# Patient Record
Sex: Male | Born: 1980 | Race: Black or African American | Hispanic: No | Marital: Single
Health system: Southern US, Community
[De-identification: ages and names within clinical notes are randomized; demographics above are authoritative.]

## PROBLEM LIST (undated history)

## (undated) DIAGNOSIS — I10 Essential (primary) hypertension: Secondary | ICD-10-CM

## (undated) DIAGNOSIS — E785 Hyperlipidemia, unspecified: Secondary | ICD-10-CM

---

## 1998-01-06 ENCOUNTER — Emergency Department (HOSPITAL_COMMUNITY): Admission: EM | Admit: 1998-01-06 | Discharge: 1998-01-06 | Payer: Self-pay | Admitting: Emergency Medicine

## 2000-10-05 ENCOUNTER — Emergency Department (HOSPITAL_COMMUNITY): Admission: EM | Admit: 2000-10-05 | Discharge: 2000-10-05 | Payer: Self-pay

## 2001-02-04 ENCOUNTER — Encounter: Payer: Self-pay | Admitting: Emergency Medicine

## 2001-02-04 ENCOUNTER — Emergency Department (HOSPITAL_COMMUNITY): Admission: EM | Admit: 2001-02-04 | Discharge: 2001-02-04 | Payer: Self-pay | Admitting: Emergency Medicine

## 2001-11-06 ENCOUNTER — Encounter: Payer: Self-pay | Admitting: Emergency Medicine

## 2001-11-06 ENCOUNTER — Emergency Department (HOSPITAL_COMMUNITY): Admission: EM | Admit: 2001-11-06 | Discharge: 2001-11-06 | Payer: Self-pay | Admitting: Emergency Medicine

## 2001-11-25 ENCOUNTER — Emergency Department (HOSPITAL_COMMUNITY): Admission: EM | Admit: 2001-11-25 | Discharge: 2001-11-25 | Payer: Self-pay | Admitting: Emergency Medicine

## 2001-12-09 ENCOUNTER — Emergency Department (HOSPITAL_COMMUNITY): Admission: EM | Admit: 2001-12-09 | Discharge: 2001-12-09 | Payer: Self-pay | Admitting: Emergency Medicine

## 2003-11-15 ENCOUNTER — Emergency Department (HOSPITAL_COMMUNITY): Admission: EM | Admit: 2003-11-15 | Discharge: 2003-11-16 | Payer: Self-pay | Admitting: Emergency Medicine

## 2004-05-13 ENCOUNTER — Emergency Department (HOSPITAL_COMMUNITY): Admission: EM | Admit: 2004-05-13 | Discharge: 2004-05-13 | Payer: Self-pay | Admitting: Emergency Medicine

## 2004-09-19 ENCOUNTER — Emergency Department (HOSPITAL_COMMUNITY): Admission: EM | Admit: 2004-09-19 | Discharge: 2004-09-19 | Payer: Self-pay | Admitting: Emergency Medicine

## 2004-11-20 ENCOUNTER — Emergency Department (HOSPITAL_COMMUNITY): Admission: EM | Admit: 2004-11-20 | Discharge: 2004-11-20 | Payer: Self-pay | Admitting: Emergency Medicine

## 2005-03-14 ENCOUNTER — Emergency Department (HOSPITAL_COMMUNITY): Admission: EM | Admit: 2005-03-14 | Discharge: 2005-03-14 | Payer: Self-pay | Admitting: Emergency Medicine

## 2005-05-07 ENCOUNTER — Emergency Department (HOSPITAL_COMMUNITY): Admission: EM | Admit: 2005-05-07 | Discharge: 2005-05-08 | Payer: Self-pay | Admitting: *Deleted

## 2005-05-28 ENCOUNTER — Emergency Department (HOSPITAL_COMMUNITY): Admission: EM | Admit: 2005-05-28 | Discharge: 2005-05-28 | Payer: Self-pay | Admitting: Emergency Medicine

## 2005-12-19 ENCOUNTER — Emergency Department (HOSPITAL_COMMUNITY): Admission: EM | Admit: 2005-12-19 | Discharge: 2005-12-19 | Payer: Self-pay | Admitting: Emergency Medicine

## 2006-01-28 ENCOUNTER — Emergency Department (HOSPITAL_COMMUNITY): Admission: EM | Admit: 2006-01-28 | Discharge: 2006-01-29 | Payer: Self-pay | Admitting: Emergency Medicine

## 2006-04-03 ENCOUNTER — Emergency Department (HOSPITAL_COMMUNITY): Admission: EM | Admit: 2006-04-03 | Discharge: 2006-04-03 | Payer: Self-pay | Admitting: Emergency Medicine

## 2006-04-20 ENCOUNTER — Emergency Department (HOSPITAL_COMMUNITY): Admission: EM | Admit: 2006-04-20 | Discharge: 2006-04-20 | Payer: Self-pay | Admitting: Emergency Medicine

## 2006-07-16 ENCOUNTER — Emergency Department (HOSPITAL_COMMUNITY): Admission: EM | Admit: 2006-07-16 | Discharge: 2006-07-17 | Payer: Self-pay | Admitting: Emergency Medicine

## 2006-09-23 ENCOUNTER — Emergency Department (HOSPITAL_COMMUNITY): Admission: EM | Admit: 2006-09-23 | Discharge: 2006-09-23 | Payer: Self-pay | Admitting: Emergency Medicine

## 2007-05-14 ENCOUNTER — Emergency Department (HOSPITAL_COMMUNITY): Admission: EM | Admit: 2007-05-14 | Discharge: 2007-05-15 | Payer: Self-pay | Admitting: Emergency Medicine

## 2007-06-14 ENCOUNTER — Emergency Department (HOSPITAL_COMMUNITY): Admission: EM | Admit: 2007-06-14 | Discharge: 2007-06-14 | Payer: Self-pay | Admitting: Emergency Medicine

## 2007-08-10 ENCOUNTER — Emergency Department (HOSPITAL_COMMUNITY): Admission: EM | Admit: 2007-08-10 | Discharge: 2007-08-10 | Payer: Self-pay | Admitting: Emergency Medicine

## 2007-12-07 ENCOUNTER — Emergency Department (HOSPITAL_COMMUNITY): Admission: EM | Admit: 2007-12-07 | Discharge: 2007-12-08 | Payer: Self-pay | Admitting: Emergency Medicine

## 2008-06-02 ENCOUNTER — Emergency Department (HOSPITAL_COMMUNITY): Admission: EM | Admit: 2008-06-02 | Discharge: 2008-06-02 | Payer: Self-pay | Admitting: Emergency Medicine

## 2008-10-14 ENCOUNTER — Emergency Department (HOSPITAL_COMMUNITY): Admission: EM | Admit: 2008-10-14 | Discharge: 2008-10-14 | Payer: Self-pay | Admitting: *Deleted

## 2010-04-12 ENCOUNTER — Emergency Department (HOSPITAL_COMMUNITY): Admission: EM | Admit: 2010-04-12 | Discharge: 2010-04-12 | Payer: Self-pay | Admitting: Emergency Medicine

## 2010-06-21 ENCOUNTER — Emergency Department (HOSPITAL_COMMUNITY): Admission: EM | Admit: 2010-06-21 | Discharge: 2009-09-27 | Payer: Self-pay | Admitting: Emergency Medicine

## 2010-06-25 ENCOUNTER — Emergency Department (HOSPITAL_COMMUNITY)
Admission: EM | Admit: 2010-06-25 | Discharge: 2010-06-25 | Payer: Self-pay | Source: Home / Self Care | Admitting: Emergency Medicine

## 2010-09-09 ENCOUNTER — Emergency Department (HOSPITAL_COMMUNITY)
Admission: EM | Admit: 2010-09-09 | Discharge: 2010-09-09 | Disposition: A | Discharge status: Home / Self Care | Payer: Self-pay | Attending: Emergency Medicine | Admitting: Emergency Medicine

## 2010-09-09 DIAGNOSIS — R059 Cough, unspecified: Secondary | ICD-10-CM | POA: Insufficient documentation

## 2010-09-09 DIAGNOSIS — J069 Acute upper respiratory infection, unspecified: Secondary | ICD-10-CM | POA: Insufficient documentation

## 2010-09-09 DIAGNOSIS — I10 Essential (primary) hypertension: Secondary | ICD-10-CM | POA: Insufficient documentation

## 2010-09-09 DIAGNOSIS — R05 Cough: Secondary | ICD-10-CM | POA: Insufficient documentation

## 2010-09-27 LAB — SYNOVIAL CELL COUNT + DIFF, W/ CRYSTALS
Crystals, Fluid: NONE SEEN
Eosinophils-Synovial: 0 % (ref 0–1)
Lymphocytes-Synovial Fld: 72 % — ABNORMAL HIGH (ref 0–20)
Monocyte-Macrophage-Synovial Fluid: 18 % — ABNORMAL LOW (ref 50–90)
Neutrophil, Synovial: 10 % (ref 0–25)
WBC, Synovial: 385 /mm3 — ABNORMAL HIGH (ref 0–200)

## 2010-09-27 LAB — BODY FLUID CULTURE: Culture: NO GROWTH

## 2010-09-27 LAB — ANAEROBIC CULTURE

## 2010-10-05 LAB — RAPID STREP SCREEN (MED CTR MEBANE ONLY): Streptococcus, Group A Screen (Direct): NEGATIVE

## 2010-10-05 LAB — POCT I-STAT, CHEM 8
Calcium, Ion: 1.21 mmol/L (ref 1.12–1.32)
Chloride: 104 mEq/L (ref 96–112)
Glucose, Bld: 100 mg/dL — ABNORMAL HIGH (ref 70–99)
HCT: 51 % (ref 39.0–52.0)
Potassium: 4.2 mEq/L (ref 3.5–5.1)
TCO2: 29 mmol/L (ref 0–100)

## 2010-10-10 ENCOUNTER — Emergency Department (HOSPITAL_COMMUNITY)
Admission: EM | Admit: 2010-10-10 | Discharge: 2010-10-10 | Disposition: A | Discharge status: Home / Self Care | Payer: Self-pay | Attending: Emergency Medicine | Admitting: Emergency Medicine

## 2010-10-10 DIAGNOSIS — R599 Enlarged lymph nodes, unspecified: Secondary | ICD-10-CM | POA: Insufficient documentation

## 2010-10-10 DIAGNOSIS — J020 Streptococcal pharyngitis: Secondary | ICD-10-CM | POA: Insufficient documentation

## 2010-10-24 LAB — URINALYSIS, ROUTINE W REFLEX MICROSCOPIC
Glucose, UA: NEGATIVE mg/dL
Leukocytes, UA: NEGATIVE
Nitrite: NEGATIVE
Protein, ur: 30 mg/dL — AB
Urobilinogen, UA: 0.2 mg/dL (ref 0.0–1.0)
pH: 5.5 (ref 5.0–8.0)

## 2010-10-24 LAB — URINE MICROSCOPIC-ADD ON

## 2011-04-04 LAB — URINALYSIS, ROUTINE W REFLEX MICROSCOPIC
Glucose, UA: NEGATIVE
Hgb urine dipstick: NEGATIVE
Ketones, ur: NEGATIVE
Protein, ur: NEGATIVE
Urobilinogen, UA: 0.2

## 2011-04-04 LAB — COMPREHENSIVE METABOLIC PANEL
AST: 27
Alkaline Phosphatase: 69
CO2: 24
GFR calc Af Amer: >60
Glucose, Bld: 101 — ABNORMAL HIGH
Sodium: 137

## 2011-04-04 LAB — DIFFERENTIAL
Basophils Relative: 0
Eosinophils Absolute: 0.1
Monocytes Absolute: 0.3
Monocytes Relative: 5

## 2011-04-04 LAB — CBC: WBC: 7

## 2011-05-07 ENCOUNTER — Emergency Department (HOSPITAL_COMMUNITY)
Admission: EM | Admit: 2011-05-07 | Discharge: 2011-05-07 | Disposition: A | Discharge status: Home / Self Care | Payer: Self-pay | Attending: Emergency Medicine | Admitting: Emergency Medicine

## 2011-05-07 DIAGNOSIS — L0100 Impetigo, unspecified: Secondary | ICD-10-CM | POA: Insufficient documentation

## 2012-03-06 ENCOUNTER — Emergency Department (HOSPITAL_COMMUNITY)
Admission: EM | Admit: 2012-03-06 | Discharge: 2012-03-06 | Disposition: A | Discharge status: Home / Self Care | Payer: Self-pay | Attending: Emergency Medicine | Admitting: Emergency Medicine

## 2012-03-06 ENCOUNTER — Encounter (HOSPITAL_COMMUNITY): Payer: Self-pay | Admitting: Emergency Medicine

## 2012-03-06 DIAGNOSIS — F439 Reaction to severe stress, unspecified: Secondary | ICD-10-CM

## 2012-03-06 DIAGNOSIS — F430 Acute stress reaction: Secondary | ICD-10-CM | POA: Insufficient documentation

## 2012-03-06 DIAGNOSIS — E860 Dehydration: Secondary | ICD-10-CM | POA: Insufficient documentation

## 2012-03-06 LAB — POCT I-STAT, CHEM 8
Calcium, Ion: 1.23 mmol/L (ref 1.12–1.23)
Glucose, Bld: 98 mg/dL (ref 70–99)
HCT: 52 % (ref 39.0–52.0)
Hemoglobin: 17.7 g/dL — ABNORMAL HIGH (ref 13.0–17.0)

## 2012-03-06 MED ORDER — TRAMADOL HCL 50 MG PO TABS
50.0000 mg | ORAL_TABLET | Freq: Once | ORAL | Status: AC
  Administered 2012-03-06: 50 mg via ORAL
  Filled 2012-03-06: qty 1

## 2012-03-06 NOTE — ED Notes (Signed)
Pt states he started feeling bad toward the end of last week  Pt states he has had the N/V/D since Sunday but has been worse since Tuesday  Pt states he has been having body aches and chills as well   Pt states he has been using OTC meds without relief

## 2012-03-06 NOTE — ED Provider Notes (Signed)
History     CSN: [id number]  Arrival date & time 03/06/12  0247   First MD Initiated Contact with Patient 03/06/12 0403      Chief Complaint  Patient presents with  . Nausea  . Emesis  . Diarrhea    (Consider location/radiation/quality/duration/timing/severity/associated sxs/prior treatment) HPI  Pt to the ER requesting note for work to be able to sleep this weekend. 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 He says that he is not getting any sleep and they will not let him have his time off. He says that the anxiety is causing him to have nausea, intermittent episodes of vomiting and some "nervous diarrhea". He denies having any abdominal pain. He admits throughout his life whenever he is stressed he has theses symptoms. He denies having fevers, chills, weakness. VSS/NAD.  History reviewed. No pertinent past medical history.  History reviewed. No pertinent past surgical history.  Family History  Problem Relation Age of Onset  . Hypertension Other   . Diabetes Other   . Coronary artery disease Other     History  Substance Use Topics  . Smoking status: Never Smoker   . Smokeless tobacco: Not on file  . Alcohol Use: No      Review of Systems  All other systems reviewed and are negative.    Allergies  Review of patient's allergies indicates no known allergies.  Home Medications   Current Outpatient Rx  Name Route Sig Dispense Refill  . IBUPROFEN 200 MG PO TABS Oral Take 200 mg by mouth every 6 (six) hours as needed.      BP 149/99  Pulse 95  Temp 98.3 F (36.8 C) (Oral)  Resp 16  Ht 6' (1.829 m)  Wt 255 lb (115.667 kg)  BMI 34.58 kg/m2  SpO2 100%  Physical Exam  Nursing note and vitals reviewed. Constitutional: He appears well-developed and well-nourished. No distress.  HENT:  Head: Normocephalic and atraumatic.  Eyes: Pupils are equal, round, and reactive to  light.  Neck: Normal range of motion. Neck supple.  Cardiovascular: Normal rate and regular rhythm.   Pulmonary/Chest: Effort normal.  Abdominal: Soft. He exhibits no distension. There is no tenderness. There is no rebound and no guarding.  Neurological: He is alert.  Skin: Skin is warm and dry.    ED Course  Procedures (including critical care time)  Labs Reviewed  POCT I-STAT, CHEM 8 - Abnormal; Notable for the following:    Hemoglobin 17.7 (*)     All other components within normal limits   No results found.   1. Dehydration   2. Stress       MDM  Pt tired from work, I-stat normal. Will give weekend off so he can sleep. He said the changes at [facility] will only last two more weeks. No concerning symptoms that warrant further work-up. Pt declines ACT evaluation.   Pt has been advised of the symptoms that warrant their return to the ED. Patient has voiced understanding and has agreed to follow-up with the PCP or specialist.       Dorthula Matas, PA 03/06/12 [phone]

## 2012-03-07 NOTE — ED Provider Notes (Signed)
Medical screening examination/treatment/procedure(s) were performed by non-physician practitioner and as supervising physician I was immediately available for consultation/collaboration.   Vandy Tsuchiya, MD 03/07/12 2302 

## 2012-07-16 ENCOUNTER — Emergency Department (HOSPITAL_COMMUNITY)
Admission: EM | Admit: 2012-07-16 | Discharge: 2012-07-16 | Disposition: A | Discharge status: Home / Self Care | Payer: Self-pay | Attending: Emergency Medicine | Admitting: Emergency Medicine

## 2012-07-16 ENCOUNTER — Encounter (HOSPITAL_COMMUNITY): Payer: Self-pay | Admitting: Emergency Medicine

## 2012-07-16 DIAGNOSIS — R6889 Other general symptoms and signs: Secondary | ICD-10-CM | POA: Insufficient documentation

## 2012-07-16 DIAGNOSIS — R05 Cough: Secondary | ICD-10-CM | POA: Insufficient documentation

## 2012-07-16 DIAGNOSIS — J329 Chronic sinusitis, unspecified: Secondary | ICD-10-CM | POA: Insufficient documentation

## 2012-07-16 DIAGNOSIS — R059 Cough, unspecified: Secondary | ICD-10-CM | POA: Insufficient documentation

## 2012-07-16 DIAGNOSIS — R197 Diarrhea, unspecified: Secondary | ICD-10-CM | POA: Insufficient documentation

## 2012-07-16 DIAGNOSIS — J3489 Other specified disorders of nose and nasal sinuses: Secondary | ICD-10-CM | POA: Insufficient documentation

## 2012-07-16 MED ORDER — AMOXICILLIN 500 MG PO CAPS: 500.0000 mg | ORAL_CAPSULE | Freq: Three times a day (TID) | ORAL | Status: DC

## 2012-07-16 MED ORDER — ALBUTEROL SULFATE HFA 108 (90 BASE) MCG/ACT IN AERS
2.0000 | INHALATION_SPRAY | RESPIRATORY_TRACT | Status: DC | PRN
  Administered 2012-07-16: 2 via RESPIRATORY_TRACT
  Filled 2012-07-16: qty 6.7

## 2012-07-16 NOTE — ED Provider Notes (Signed)
Medical screening examination/treatment/procedure(s) were performed by non-physician practitioner and as supervising physician I was immediately available for consultation/collaboration.  Ethelda Chick, MD 07/16/12 [phone]

## 2012-07-16 NOTE — ED Provider Notes (Signed)
History     CSN: [id number]  Arrival date & time 07/16/12  0865   First MD Initiated Contact with Patient 07/16/12 [phone]      Chief Complaint  Patient presents with  . URI    (Consider location/radiation/quality/duration/timing/severity/associated sxs/prior treatment) HPI Comments: This is a 32 year old male, who presents emergency department with chief complaint of cough and cold symptoms x3 weeks. Patient also endorses sinus congestion, runny nose, and cough. States that yesterday he began having some diarrhea as well. He has taken over-the-counter cough and cold medicines with little relief. His symptoms have been persistent, without improvement. He denies fever, chest pain, shortness of breath, nausea, and vomiting.  The history is provided by the patient. No language interpreter was used.    History reviewed. No pertinent past medical history.  History reviewed. No pertinent past surgical history.  Family History  Problem Relation Age of Onset  . Hypertension Other   . Diabetes Other   . Coronary artery disease Other     History  Substance Use Topics  . Smoking status: Never Smoker   . Smokeless tobacco: Not on file  . Alcohol Use: No      Review of Systems  All other systems reviewed and are negative.    Allergies  Review of patient's allergies indicates no known allergies.  Home Medications   Current Outpatient Rx  Name  Route  Sig  Dispense  Refill  . IBUPROFEN 200 MG PO TABS   Oral   Take 200 mg by mouth every 6 (six) hours as needed.           BP 155/79  Pulse 107  Temp 98.3 F (36.8 C) (Oral)  Resp 16  SpO2 99%  Physical Exam  Nursing note and vitals reviewed. Constitutional: He is oriented to person, place, and time. He appears well-developed and well-nourished.  HENT:  Head: Normocephalic and atraumatic.  Right Ear: External ear normal.  Left Ear: External ear normal.  Nose: Nose normal.  Mouth/Throat: Oropharynx is clear and  moist. No oropharyngeal exudate.       Swollen, erythematous turbinates, maxillary sinuses tender to palpation  Eyes: Conjunctivae normal and EOM are normal. Pupils are equal, round, and reactive to light. Right eye exhibits no discharge. Left eye exhibits no discharge. No scleral icterus.  Neck: Normal range of motion. Neck supple. No JVD present.  Cardiovascular: Normal rate, regular rhythm, normal heart sounds and intact distal pulses.  Exam reveals no gallop and no friction rub.   No murmur heard. Pulmonary/Chest: Effort normal and breath sounds normal. No respiratory distress. He has no wheezes. He has no rales. He exhibits no tenderness.  Abdominal: Soft. Bowel sounds are normal. He exhibits no distension and no mass. There is no tenderness. There is no rebound and no guarding.  Musculoskeletal: Normal range of motion. He exhibits no edema and no tenderness.  Neurological: He is alert and oriented to person, place, and time. He has normal reflexes.       CN 3-12 intact  Skin: Skin is warm and dry.  Psychiatric: He has a normal mood and affect. His behavior is normal. Judgment and thought content normal.    ED Course  Procedures (including critical care time)  Labs Reviewed - No data to display No results found.   1. Sinusitis       MDM  32 year old male with sinusitis.  Will treat the patient with Amoxicillin as he has been having symptoms for almost  3 weeks.  Will also give the patient and inhaler, and recommended Sudafed and Delysm.  The patient understands and agrees with the plan.  He is stable and ready for discharge.        Roxy Horseman, PA-C 07/16/12 [phone]

## 2012-07-16 NOTE — ED Notes (Signed)
Pt c/o cold s/s 3 weeks, now having diarrhea, increasing weakness.

## 2014-11-29 ENCOUNTER — Ambulatory Visit (INDEPENDENT_AMBULATORY_CARE_PROVIDER_SITE_OTHER): Payer: No Typology Code available for payment source | Admitting: Emergency Medicine

## 2014-11-29 VITALS — BP 126/70 | HR 100 | Temp 98.2°F | Resp 18 | Wt 251.0 lb

## 2014-11-29 DIAGNOSIS — S335XXA Sprain of ligaments of lumbar spine, initial encounter: Secondary | ICD-10-CM | POA: Diagnosis not present

## 2014-11-29 MED ORDER — HYDROCODONE-ACETAMINOPHEN 5-325 MG PO TABS: 1.0000 | ORAL_TABLET | ORAL | Status: DC | PRN

## 2014-11-29 MED ORDER — NAPROXEN SODIUM 550 MG PO TABS: 550.0000 mg | ORAL_TABLET | Freq: Two times a day (BID) | ORAL | Status: AC

## 2014-11-29 MED ORDER — CYCLOBENZAPRINE HCL 10 MG PO TABS: 10.0000 mg | ORAL_TABLET | Freq: Three times a day (TID) | ORAL | Status: DC | PRN

## 2014-11-29 NOTE — Patient Instructions (Signed)

## 2014-11-29 NOTE — Progress Notes (Signed)
Subjective:  Patient ID: Bobby Jones, male    DOB: 03/11/1981  Age: 34 y.o. MRN: [id number]  CC: Back Pain   HPI Bobby RidgeCarlos Sarris presents for back pain that began 2:00 yesterday afternoon. He offers no history of injury or overuse. Said that he went to work last night was unable to perform his duties slept all day and then when he attempted  to go to work today he was unable to do so despite taking medicine provided by his mother  He says that he has sharp lancinating pain in his back that radiates down both legs to feet intermittently. Worse when he bends twists or stands up. He has no history of prior back injury. He has no numbness tingling or weakness in his legs.  He has no urinary or stool incontinence. Has no improvement with home medications.  He denies any other current complaint.  History Mikle BosworthCarlos has no past medical history on file.   He has no past surgical history on file.   His family history includes Coronary artery disease in his other; Diabetes in his father and other; Hypertension in his other.He reports that he has never smoked. He does not have any smokeless tobacco history on file. He reports that he does not drink alcohol or use illicit drugs.  Outpatient Prescriptions Prior to Visit  Medication Sig Dispense Refill  . amoxicillin (AMOXIL) 500 MG capsule Take 1 capsule (500 mg total) by mouth 3 (three) times daily. (Patient not taking: Reported on 11/29/2014) 21 capsule 0  . ibuprofen (ADVIL,MOTRIN) 200 MG tablet Take 200 mg by mouth every 6 (six) hours as needed.     No facility-administered medications prior to visit.    ROS Review of Systems  Constitutional: Negative for fever, chills, diaphoresis, activity change, appetite change, fatigue HENT: Negative for , congestion, sore throat, rhinorrhea, sneezing, trouble swallowing, neck pain,  postnasal drip and tinnitus.  Eyes: Negative for photophobia, pain, discharge, redness and visual disturbance.  Respiratory:  Negative for cough, chest tightness, shortness of breath and wheezing.  Cardiovascular: Negative for chest pain, palpitations   Gastrointestinal: Negative for nausea, abdominal pain, diarrhea, constipation and blood in stool.  Endocrine: Negative for  polyuria.  Genitourinary: Negative for dysuria, urgency, frequency, hematuria,  Musculoskeletal: Negative for back pain and gait problem.  Neurological: Negative for dizziness, weakness and headaches.  Psychiatric/Behavioral: Negative for  dysphoric mood. The patient is not nervous/anxious.      Objective:  BP 126/70 mmHg  Pulse 100  Temp(Src) 98.2 F (36.8 C) (Oral)  Resp 18  Wt 251 lb (113.853 kg)  SpO2 99%  Physical Exam  GEN: WDWN, moderate distress, Non-toxic, A & O x 3 HEENT: Atraumatic, Normocephalic. Neck supple. No masses, No LAD. Ears and Nose: No external deformity. CV: RRR, No M/G/R. No JVD. No thrill. No extra heart sounds. PULM: CTA B, no wheezes, crackles, rhonchi. No retractions. No resp. distress. No accessory muscle use. ABD: S, NT, ND, +BS. No rebound. No HSM. EXTR: No c/c/e NEURO Normal gait.  PSYCH: Normally interactive. Conversant. Not depressed or anxious appearing.  Calm demeanor.  BACK  Tender lumbar paraspinous muscles.  No weakness  Assessment & Plan:   Mikle BosworthCarlos was seen today for back pain.  Diagnoses and all orders for this visit:  Sprain of lumbar region, initial encounter  Other orders -     naproxen sodium (ANAPROX DS) 550 MG tablet; Take 1 tablet (550 mg total) by mouth 2 (two) times daily with a meal. -  cyclobenzaprine (FLEXERIL) 10 MG tablet; Take 1 tablet (10 mg total) by mouth 3 (three) times daily as needed for muscle spasms. -     HYDROcodone-acetaminophen (NORCO) 5-325 MG per tablet; Take 1-2 tablets by mouth every 4 (four) hours as needed.   I am having Mr. Detamore start on naproxen sodium, cyclobenzaprine, and HYDROcodone-acetaminophen. I am also having him maintain his ibuprofen  and amoxicillin.  Meds ordered this encounter  Medications  . naproxen sodium (ANAPROX DS) 550 MG tablet    Sig: Take 1 tablet (550 mg total) by mouth 2 (two) times daily with a meal.    Dispense:  40 tablet    Refill:  0  . cyclobenzaprine (FLEXERIL) 10 MG tablet    Sig: Take 1 tablet (10 mg total) by mouth 3 (three) times daily as needed for muscle spasms.    Dispense:  30 tablet    Refill:  0  . HYDROcodone-acetaminophen (NORCO) 5-325 MG per tablet    Sig: Take 1-2 tablets by mouth every 4 (four) hours as needed.    Dispense:  30 tablet    Refill:  0     Follow-up: No Follow-up on file.  Carmelina DaneAnderson, Gerline Ratto S, MD

## 2014-11-30 ENCOUNTER — Encounter (HOSPITAL_COMMUNITY): Payer: Self-pay | Admitting: Emergency Medicine

## 2014-11-30 ENCOUNTER — Emergency Department (HOSPITAL_COMMUNITY)
Admission: EM | Admit: 2014-11-30 | Discharge: 2014-11-30 | Disposition: A | Discharge status: Home / Self Care | Payer: No Typology Code available for payment source | Attending: Emergency Medicine | Admitting: Emergency Medicine

## 2014-11-30 DIAGNOSIS — I2510 Atherosclerotic heart disease of native coronary artery without angina pectoris: Secondary | ICD-10-CM | POA: Insufficient documentation

## 2014-11-30 DIAGNOSIS — M5441 Lumbago with sciatica, right side: Secondary | ICD-10-CM | POA: Insufficient documentation

## 2014-11-30 DIAGNOSIS — M5442 Lumbago with sciatica, left side: Secondary | ICD-10-CM | POA: Diagnosis not present

## 2014-11-30 DIAGNOSIS — M549 Dorsalgia, unspecified: Secondary | ICD-10-CM | POA: Diagnosis present

## 2014-11-30 DIAGNOSIS — I10 Essential (primary) hypertension: Secondary | ICD-10-CM | POA: Diagnosis not present

## 2014-11-30 DIAGNOSIS — Z792 Long term (current) use of antibiotics: Secondary | ICD-10-CM | POA: Insufficient documentation

## 2014-11-30 DIAGNOSIS — E119 Type 2 diabetes mellitus without complications: Secondary | ICD-10-CM | POA: Insufficient documentation

## 2014-11-30 DIAGNOSIS — Z7952 Long term (current) use of systemic steroids: Secondary | ICD-10-CM | POA: Diagnosis not present

## 2014-11-30 DIAGNOSIS — Z791 Long term (current) use of non-steroidal anti-inflammatories (NSAID): Secondary | ICD-10-CM | POA: Diagnosis not present

## 2014-11-30 MED ORDER — TRAMADOL HCL 50 MG PO TABS: 50.0000 mg | ORAL_TABLET | Freq: Four times a day (QID) | ORAL | Status: DC | PRN

## 2014-11-30 MED ORDER — PREDNISONE 20 MG PO TABS: ORAL_TABLET | ORAL | Status: DC

## 2014-11-30 MED ORDER — METHOCARBAMOL 500 MG PO TABS: 500.0000 mg | ORAL_TABLET | Freq: Two times a day (BID) | ORAL | Status: DC

## 2014-11-30 NOTE — ED Provider Notes (Signed)
CSN: [id number]     Arrival date & time 11/30/14  1457 History  This chart was scribed for non-physician practitioner Vangie BickerErin O' San MorelleMalley, PA-C, working with Purvis SheffieldForrest Harrison, MD, by Andrew Auaven Small, ED Scribe. This patient was seen in room WTR5/WTR5 and the patient's care was started at 3:51 PM.   Chief Complaint  Patient presents with  . Back Pain   The history is provided by the patient. No language interpreter was used.   Bobby RidgeCarlos Perkin is a 34 y.o. male with hx of HTN, DM and CAD who presents to the Emergency Department complaining of back pain. 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 He denies heavy lifting at work but does bend over and twisting often. [facility] and was prescribed flexeril, norco and naproxen and was given 2 days off from work. He present today with worsening back pain that radiates down bilateral legs. Pt reports back injury several years ago but denies back surgeries. Pt denies being seen by ortho. He denies nausea, emesis and diarrhea.  Denies fever or chills. No loss of bowel or bladder. Denies hx of IVDU or cancer.   History reviewed. No pertinent past medical history. History reviewed. No pertinent past surgical history. Family History  Problem Relation Age of Onset  . Hypertension Other   . Diabetes Other   . Coronary artery disease Other   . Diabetes Father    History  Substance Use Topics  . Smoking status: Never Smoker   . Smokeless tobacco: Not on file  . Alcohol Use: No    Review of Systems  Gastrointestinal: Negative for nausea, vomiting and diarrhea.  Musculoskeletal: Positive for myalgias and back pain.   Allergies  Review of patient's allergies indicates no known allergies.  Home Medications   Prior to Admission medications   Medication Sig Start Date End Date Taking? Authorizing  Provider  amoxicillin (AMOXIL) 500 MG capsule Take 1 capsule (500 mg total) by mouth 3 (three) times daily. Patient not taking: Reported on 11/29/2014 07/16/12   Roxy Horsemanobert Browning, PA-C  HYDROcodone-acetaminophen [facility](NORCO) 5-325 MG per tablet Take 1-2 tablets by mouth every 4 (four) hours as needed. 11/29/14   Carmelina DaneJeffery S Anderson, MD  ibuprofen (ADVIL,MOTRIN) 200 MG tablet Take 200 mg by mouth every 6 (six) hours as needed.    Historical Provider, MD  methocarbamol (ROBAXIN) 500 MG tablet Take 1 tablet (500 mg total) by mouth 2 (two) times daily. 11/30/14   Junius FinnerErin O'Malley, PA-C  naproxen sodium (ANAPROX DS) 550 MG tablet Take 1 tablet (550 mg total) by mouth 2 (two) times daily with a meal. 11/29/14 11/29/15  Carmelina DaneJeffery S Anderson, MD  predniSONE (DELTASONE) 20 MG tablet 3 tabs po day one, then 2 po daily x 4 days 11/30/14   Junius FinnerErin O'Malley, PA-C  traMADol (ULTRAM) 50 MG tablet Take 1 tablet (50 mg total) by mouth every 6 (six) hours as needed. 11/30/14   Junius FinnerErin O'Malley, PA-C   BP 148/90 mmHg  Pulse 102  Temp(Src) 98.3 F (36.8 C) (Oral)  Resp 20  SpO2 100% Physical Exam  Constitutional: He is oriented to person, place, and time. He appears well-developed and well-nourished. No distress.  HENT:  Head: Normocephalic and atraumatic.  Eyes: Conjunctivae and EOM are normal.  Neck: Neck supple.  Cardiovascular: Normal rate.   Pulmonary/Chest: Effort  normal.  Musculoskeletal: Normal range of motion. He exhibits tenderness.  FROM upper and lower extremities with 5/5 strength.  Tenderness to lower lumbar spine and bilateral lumbar muscles. No spinal step offs or crepitus.  Neurological: He is alert and oriented to person, place, and time.  Sensation in tact, symmetric bilaterally in upper and lower extremities.  Normal gait.  Skin: Skin is warm and dry. No erythema.  Low back: skin in tact, no ecchymosis or erythema. No rash.   Psychiatric: He has a normal mood and affect. His behavior is normal.  Nursing note  and vitals reviewed.   ED Course  Procedures (including critical care time) DIAGNOSTIC STUDIES: Oxygen Saturation is 100% on RA, normal by my interpretation.    COORDINATION OF CARE: 4:06 PM- Pt advised of plan for treatment and pt agrees.  Labs Review Labs Reviewed - No data to display  Imaging Review No results found.   EKG Interpretation None      MDM   Final diagnoses:  Bilateral low back pain with left-sided sciatica  Bilateral low back pain with right-sided sciatica    Pt is a 34yo male c/o continued lower back pain, no red flag symptoms. No hx of injury. No imaging indicated at this time. Will change pt's medication. Advised discontinuing flexeril, will prescribe robaxin, tramadol and prednisone. Home care instructions provided along with home stretches for sciatica as pain does occasional go down both legs. Encouraged to establish care with PCP, f/u in 1 week if not improving. Return precautions provided. Pt verbalized understanding and agreement with tx plan.   I personally performed the services described in this documentation, which was scribed in my presence. The recorded information has been reviewed and is accurate.    Junius Finnerrin O'Malley, PA-C 11/30/14 1642  Mirian MoMatthew Gentry, MD 12/02/14 [phone]

## 2014-11-30 NOTE — Discharge Instructions (Signed)
Back Pain, Adult °Back pain is very common. The pain often gets better over time. The cause of back pain is usually not dangerous. Most people can learn to manage their back pain on their own.  °HOME CARE  °· Stay active. Start with short walks on flat ground if you can. Try to walk farther each day. °· Do not sit, drive, or stand in one place for more than 30 minutes. Do not stay in bed. °· Do not avoid exercise or work. Activity can help your back heal faster. °· Be careful when you bend or lift an object. Bend at your knees, keep the object close to you, and do not twist. °· Sleep on a firm mattress. Lie on your side, and bend your knees. If you lie on your back, put a pillow under your knees. °· Only take medicines as told by your doctor. °· Put ice on the injured area. °¨ Put ice in a plastic bag. °¨ Place a towel between your skin and the bag. °¨ Leave the ice on for 15-20 minutes, 03-04 times a day for the first 2 to 3 days. After that, you can switch between ice and heat packs. °· Ask your doctor about back exercises or massage. °· Avoid feeling anxious or stressed. Find good ways to deal with stress, such as exercise. °GET HELP RIGHT AWAY IF:  °· Your pain does not go away with rest or medicine. °· Your pain does not go away in 1 week. °· You have new problems. °· You do not feel well. °· The pain spreads into your legs. °· You cannot control when you poop (bowel movement) or pee (urinate). °· Your arms or legs feel weak or lose feeling (numbness). °· You feel sick to your stomach (nauseous) or throw up (vomit). °· You have belly (abdominal) pain. °· You feel like you may pass out (faint). °MAKE SURE YOU:  °· Understand these instructions. °· Will watch your condition. °· Will get help right away if you are not doing well or get worse. °Document Released: 12/18/2007 Document Revised: 09/23/2011 Document Reviewed: 11/02/2013 °[employer]® Patient Information ©2015 [employer]. This information is not intended  to replace advice given to you by your health care provider. Make sure you discuss any questions you have with your health care provider. ° °

## 2014-11-30 NOTE — ED Notes (Signed)
Pt states that he was seen by [facility] for [facility] pain since Sunday that radiates down his legs and was prescribed flexeril, hydrocodone and naproxen but states that he is still hurting. Alert and oriented.

## 2015-05-08 ENCOUNTER — Encounter (HOSPITAL_COMMUNITY): Payer: Self-pay | Admitting: Emergency Medicine

## 2015-05-08 ENCOUNTER — Emergency Department (HOSPITAL_COMMUNITY)
Admission: EM | Admit: 2015-05-08 | Discharge: 2015-05-08 | Disposition: A | Discharge status: Home / Self Care | Payer: No Typology Code available for payment source | Attending: Emergency Medicine | Admitting: Emergency Medicine

## 2015-05-08 DIAGNOSIS — H9202 Otalgia, left ear: Secondary | ICD-10-CM | POA: Insufficient documentation

## 2015-05-08 DIAGNOSIS — Z79899 Other long term (current) drug therapy: Secondary | ICD-10-CM | POA: Insufficient documentation

## 2015-05-08 DIAGNOSIS — Z7952 Long term (current) use of systemic steroids: Secondary | ICD-10-CM | POA: Insufficient documentation

## 2015-05-08 DIAGNOSIS — J069 Acute upper respiratory infection, unspecified: Secondary | ICD-10-CM | POA: Diagnosis not present

## 2015-05-08 DIAGNOSIS — Z791 Long term (current) use of non-steroidal anti-inflammatories (NSAID): Secondary | ICD-10-CM | POA: Diagnosis not present

## 2015-05-08 DIAGNOSIS — J029 Acute pharyngitis, unspecified: Secondary | ICD-10-CM | POA: Diagnosis present

## 2015-05-08 LAB — RAPID STREP SCREEN (MED CTR MEBANE ONLY): STREPTOCOCCUS, GROUP A SCREEN (DIRECT): NEGATIVE

## 2015-05-08 NOTE — ED Provider Notes (Signed)
CSN: [id number]     Arrival date & time 05/08/15  1457 History  By signing my name below, I, Bobby Jones, attest that this documentation has been prepared under the direction and in the presence of [facility] Hospital[facility]ily Chozen Latulippe, PA-C. Electronically Signed: Sonum Jones, Neurosurgeoncribe. 05/08/2015. 5:20 PM.    Chief Complaint  Patient presents with  . URI    The history is provided by the patient. No language interpreter was used.    HPI Comments: Bobby Jones is a 34 y.o. male who presents to the Emergency Department complaining of 1 day of gradual onset, gradually worsening, constant sore throat with associated left sided otalgia, rhinorrhea, chills, myalgias, and a dry cough. He denies sick contacts. He has taken DayQuil and using cough drops with minimal relief. He denies nasal congestion, fever, CP, SOB.   History reviewed. No pertinent past medical history. History reviewed. No pertinent past surgical history. Family History  Problem Relation Age of Onset  . Hypertension Other   . Diabetes Other   . Coronary artery disease Other   . Diabetes Father    Social History  Substance Use Topics  . Smoking status: Never Smoker   . Smokeless tobacco: None  . Alcohol Use: No    Review of Systems  Constitutional: Positive for chills. Negative for fever.  HENT: Positive for ear pain, rhinorrhea and sore throat. Negative for congestion and trouble swallowing.   Respiratory: Positive for cough. Negative for shortness of breath.   Cardiovascular: Negative for chest pain.  Skin: Negative for rash.  Allergic/Immunologic: Negative for immunocompromised state.  Hematological: Does not bruise/bleed easily.  Psychiatric/Behavioral: Negative for self-injury.      Allergies  Review of patient's allergies indicates no known allergies.  Home Medications   Prior to Admission medications   Medication Sig Start Date End Date Taking? Authorizing Provider  amoxicillin (AMOXIL) 500 MG capsule Take 1 capsule (500 mg  total) by mouth 3 (three) times daily. Patient not taking: Reported on 11/29/2014 07/16/12   Roxy Horsemanobert Browning, PA-C  HYDROcodone-acetaminophen [facility](NORCO) 5-325 MG per tablet Take 1-2 tablets by mouth every 4 (four) hours as needed. 11/29/14   Carmelina DaneJeffery S Anderson, MD  ibuprofen (ADVIL,MOTRIN) 200 MG tablet Take 200 mg by mouth every 6 (six) hours as needed.    Historical Provider, MD  methocarbamol (ROBAXIN) 500 MG tablet Take 1 tablet (500 mg total) by mouth 2 (two) times daily. 11/30/14   Junius FinnerErin O'Malley, PA-C  naproxen sodium (ANAPROX DS) 550 MG tablet Take 1 tablet (550 mg total) by mouth 2 (two) times daily with a meal. 11/29/14 11/29/15  Carmelina DaneJeffery S Anderson, MD  predniSONE (DELTASONE) 20 MG tablet 3 tabs po day one, then 2 po daily x 4 days 11/30/14   Junius FinnerErin O'Malley, PA-C  traMADol (ULTRAM) 50 MG tablet Take 1 tablet (50 mg total) by mouth every 6 (six) hours as needed. 11/30/14   Junius FinnerErin O'Malley, PA-C   BP 155/98 mmHg  Pulse 99  Temp(Src) 98.3 F (36.8 C) (Oral)  Resp 18  SpO2 98% Physical Exam  Constitutional: He appears well-developed and well-nourished. No distress.  HENT:  Head: Normocephalic and atraumatic.  Mouth/Throat: Posterior oropharyngeal erythema present. No oropharyngeal exudate.  Eyes: Conjunctivae and EOM are normal. Right eye exhibits no discharge. Left eye exhibits no discharge.  Neck: Normal range of motion. Neck supple.  Cardiovascular: Normal rate and regular rhythm.   Pulmonary/Chest: Effort normal and breath sounds normal. No stridor. No respiratory distress. He has no wheezes. He has no  rales.  Lymphadenopathy:    He has cervical adenopathy (mild).  Neurological: He is alert.  Skin: He is not diaphoretic.  Nursing note and vitals reviewed.   ED Course  Procedures (including critical care time)  DIAGNOSTIC STUDIES: Oxygen Saturation is 98% on RA, normal by my interpretation.    COORDINATION OF CARE: 5:23 PM Discussed treatment plan with pt at bedside and pt agreed to  plan.   Labs Review Labs Reviewed  RAPID STREP SCREEN (NOT AT [facility])  CULTURE, GROUP A STREP    Imaging Review No results found. I have personally reviewed and evaluated these images and lab results as part of my medical decision-making.   EKG Interpretation None      MDM   Final diagnoses:  URI (upper respiratory infection)    Afebrile, nontoxic patient with constellation of symptoms suggestive of viral syndrome.  No concerning findings on exam.  Discharged home with supportive care, PCP follow up.  Discussed result, findings, treatment, and follow up  with patient.  Pt given return precautions.  Pt verbalizes understanding and agrees with plan.      I personally performed the services described in this documentation, which was scribed in my presence. The recorded information has been reviewed and is accurate.   Trixie Dredge, PA-C 05/08/15 1823  Bethann Berkshire, MD 05/11/15 [phone]

## 2015-05-08 NOTE — Discharge Instructions (Signed)
Read the information below.  You may return to the Emergency Department at any time for worsening condition or any new symptoms that concern you.  If you develop high fevers that do not resolve with tylenol or ibuprofen, you have difficulty swallowing or breathing, or you are unable to tolerate fluids by mouth, return to the ER for a recheck.      Upper Respiratory Infection, Adult Most upper respiratory infections (URIs) are caused by a virus. A URI affects the nose, throat, and upper air passages. The most common type of URI is often called "the common cold." HOME CARE   Take medicines only as told by your doctor.  Gargle warm saltwater or take cough drops to comfort your throat as told by your doctor.  Use a warm mist humidifier or inhale steam from a shower to increase air moisture. This may make it easier to breathe.  Drink enough fluid to keep your pee (urine) clear or pale yellow.  Eat soups and other clear broths.  Have a healthy diet.  Rest as needed.  Go back to work when your fever is gone or your doctor says it is okay.  You may need to stay home longer to avoid giving your URI to others.  You can also wear a face mask and wash your hands often to prevent spread of the virus.  Use your inhaler more if you have asthma.  Do not use any tobacco products, including cigarettes, chewing tobacco, or electronic cigarettes. If you need help quitting, ask your doctor. GET HELP IF:  You are getting worse, not better.  Your symptoms are not helped by medicine.  You have chills.  You are getting more short of breath.  You have brown or red mucus.  You have yellow or brown discharge from your nose.  You have pain in your face, especially when you bend forward.  You have a fever.  You have puffy (swollen) neck glands.  You have pain while swallowing.  You have white areas in the back of your throat. GET HELP RIGHT AWAY IF:   You have very bad or  constant:  Headache.  Ear pain.  Pain in your forehead, behind your eyes, and over your cheekbones (sinus pain).  Chest pain.  You have long-lasting (chronic) lung disease and any of the following:  Wheezing.  Long-lasting cough.  Coughing up blood.  A change in your usual mucus.  You have a stiff neck.  You have changes in your:  Vision.  Hearing.  Thinking.  Mood. MAKE SURE YOU:   Understand these instructions.  Will watch your condition.  Will get help right away if you are not doing well or get worse.   This information is not intended to replace advice given to you by your health care provider. Make sure you discuss any questions you have with your health care provider.   Document Released: 12/18/2007 Document Revised: 11/15/2014 Document Reviewed: 10/06/2013 Elsevier Interactive Patient Education 2016 [employer].   Emergency Department Resource Guide 1) Find a Doctor and Pay Out of Pocket Although you won't have to find out who is covered by your insurance plan, it is a good idea to ask around and get recommendations. You will then need to call the office and see if the doctor you have chosen will accept you as a new patient and what types of options they offer for patients who are self-pay. Some doctors offer discounts or will set up payment plans for  their patients who do not have insurance, but you will need to ask so you aren't surprised when you get to your appointment.  2) Contact Your Local Health Department Not all health departments have doctors that can see patients for sick visits, but many do, so it is worth a call to see if yours does. If you don't know where your local health department is, you can check in your phone book. The CDC also has a tool to help you locate your state's health department, and many state websites also have listings of all of their local health departments.  3) Find a Walk-in Clinic If your illness is not likely to be  very severe or complicated, you may want to try a walk in clinic. These are popping up all over the country in pharmacies, drugstores, and shopping centers. They're usually staffed by nurse practitioners or physician assistants that have been trained to treat common illnesses and complaints. They're usually fairly quick and inexpensive. However, if you have serious medical issues or chronic medical problems, these are probably not your best option.  No Primary Care Doctor: - Call Health Connect at  [phone] - they can help you locate a primary care doctor that  accepts your insurance, provides certain services, etc. - Physician Referral Service- [phone]  Chronic Pain Problems: Organization         Address  Phone   Notes  [facility]  [phone] Patients need to be referred by their primary care doctor.   Medication Assistance: Organization         Address  Phone   Notes  [facility] Medication [facility] [address] [place] [place], [place] [address] [phone] --Must be a resident of [facility] -- Must have NO insurance coverage whatsoever (no Medicaid/ Medicare, etc.) -- The pt. MUST have a primary care doctor that directs their care regularly and follows them in the community   MedAssist  [phone]   [employer]  [phone]    Agencies that provide inexpensive medical care: Organization         Address  Phone   Notes  [facility]  [phone]   [facility]    [phone]   [facility] [address] [place], [place] [address] [phone]   [facility] [address] [place] [place] [phone]   Planned Parenthood    [phone]   [facility]    [phone]   [facility] and [facility]  [address], [place] Phone:  [phone], Fax:  [phone] Hours of Operation:  9 am - 6 pm, M-F.  Also  accepts Medicaid/Medicare and self-pay.  [facility]  [address], [place] Phone: [phone], Fax: [phone]. Hours of Operation:  8:30 am - 5:30 pm, M-F.  Also accepts Medicaid and self-pay.  [facility] [place] [address] [place] Phone: [phone]   [facility] [address] Natasha Bence [place], [place] [phone], Ext. [phone] Mondays & Thursdays: 7-9 AM.  First 15 patients are seen on a first come, first serve basis.    Medicaid-accepting [facility] Providers:  Organization         Address  Phone   Notes  [facility] [address], [place] [phone] Also accepts self-pay patients.  [facility] [address] Laurell Josephs [place], [place]  [phone]   [facility] [address], [place] [phone]   [facility] [address] [place] [phone]   Renaye Rakers [address], [place]   [phone] Only accepts [place] patients after they have their name applied to their card.   Self-Pay (no insurance) in [facility]:  Organization         Address  Phone   Notes  Sickle Cell Patients, [facility] [address] [place], [place] [phone]   [facility] [address] [place], [place] [phone]   [facility] [place]  [address], [place] [phone]   [facility]/Dr. Osei-Bonsu  [address], [place] or [address], [place] [phone] Phone number for both [place] and [place] locations is the same.  Urgent Medical and [facility] [address], [place] [phone]   [facility] [address] [place] or [address] [phone]   [facility] [address]  [place] [phone], phone; [phone], fax Sees patients 1st and 3rd Saturday of every month.  Must not qualify for public or private insurance (i.e. Medicaid, Medicare, [place] Health Choice, Veterans' Benefits)  Household income should be no more than 200% of the poverty level The clinic cannot treat you if you are pregnant or think you are pregnant  Sexually transmitted diseases are not treated at the clinic.    Dental Care: Organization         Address  Phone  Notes  [facility] Department of [facility] [address] [place], [place] [phone] Accepts children up to age 77 who are enrolled in [place] or [place] Health Choice; pregnant women with a Medicaid card; and children who have applied for Medicaid or [place] Health Choice, but were declined, whose parents can pay a reduced fee at time of service.  [facility] Department of [facility]  [address], [place] [phone] Accepts children up to age 91 who are enrolled in [place] or [place] Health Choice; pregnant women with a Medicaid card; and children who have applied for Medicaid or [place] Health Choice, but were declined, whose parents can pay a reduced fee at time of service.  [facility]  [address] [place], [place] [phone] Patients are seen by appointment only. Walk-ins are not accepted. [facility] will see patients 46 years of age and older. Monday - Tuesday (8am-5pm) Most Wednesdays (8:30-5pm) $30 per visit, cash only  [facility]  [address], [facility] [phone] Patients are seen by appointment only. Walk-ins are not accepted. [facility] will see patients 79 years of age and older. One Wednesday Evening (Monthly: Volunteer Based).  $30 per visit, cash only  [employer]  [phone] for adults; Children under age 50, call Graduate Pediatric Dentistry at [phone].  Children aged 11-14, please call [phone] to request a pediatric application.  Dental services are provided in all areas of dental care including fillings, crowns and bridges, complete and partial dentures, implants, gum treatment, root canals, and extractions. Preventive care is also provided. Treatment is provided to both adults  and children. Patients are selected via a lottery and there is often a waiting list.   [facility] [address],  [place]  [phone] [email or web address]   [facility] [address], [place], [place] [phone], Ext. [phone] Second and Fourth Thursday of each month, opens at 6:30 AM; Clinic ends at 9 AM.  Patients are seen on a first-come first-served basis, and a limited number are seen during each clinic.   [facility]  [address], [place], [place] [phone]   Eligibility Requirements You must have lived in  [place], [place], or [place] counties for at least the last three months.   You cannot be eligible for state or federal sponsored [place]healthcare insurance, including CIGNAVeterans Administration, [place]icaid, or [employer]icare.   You generally cannot be eligible for healthcare insurance through your employer.    How to apply: Eligibility screenings are held every Tuesday and Wednesday afternoon from 1:00 pm until 4:00 pm. You do not need an appointment for the interview!  [facility] [address], [place], [place] [phone]   [facility]  [phone]   [facility]  [phone]   [facility]  [phone]    Behavioral Health Resources in the Community: Intensive Outpatient Programs Organization         Address  Phone  Notes  [facility] [address], [place], [place] [phone]   [facility] [address], [place], [place] [phone]   [facility] [address], [place], [place]  [phone]   [facility]: [facility] [address],  [place], [place] [phone] or [phone]   Substance Abuse Resources Organization         Address  Phone  Notes  [facility]  [phone]   [facility]  [phone]   [facility]  [phone]   [facility]  [phone]   Residential & Outpatient Substance Abuse Program  [phone]   Psychological Services Organization         Address  Phone  Notes  [facility]  [phone]- [phone]   [facility]  [phone]- [phone]   [facility] [address], [place] [phone] or [phone]    Mobile Crisis Teams Organization         Address  Phone  Notes  [facility], Mobile Crisis Care Unit  [phone]   [facility]  [address] [place], [place] [phone]   Doristine LocksSharon DeEsch [address] [place] [phone]    Self-Help/Support Groups Organization         Address  Phone             Notes  [facility] [place] - variety of support groups  336- [phone] Call for more information  Narcotics Anonymous (NA), [facility] [address], [place]  2 meetings at this location   Statisticianesidential Treatment Programs Organization         Address  Phone  Notes  [facility] [address],    [place]  [phone]   [facility]  [address], [place] [phone], [place], [place] [phone]   [facility] [address], [place] [phone] Admissions: 8am-3pm M-F  [facility] [address]    [place], [place] [phone]   [facility] [address] [place], [place], [place] [phone]   [facility] [address],  [place], [place] [phone]   [facility] - Intensive Outpatient [address], [place], [place] [phone]    [facility] ([facility] [address] [place].,  [place], [place] [phone] or [phone]   [facility] ([facility]) [address] [place], [place] [phone] Accepts Medicaid  [facility] [address],  [place] [phone] Substance Abuse/Addiction Treatment   [facility] Organization         Address  Phone  Notes  [facility]  [phone]   Angie Fava, PhD [address] Ervin Knack [place], [place]   [phone] or [phone]   [facility] Behavioral   [address] [place], [place] [phone]   [facility] [address] [place], [place] [phone] Insurance/Medicaid/sponsorship through [facility] [address]                                    [place], [place] [phone] Therapy/tele-psych/case  [facility] [address], [place] [phone]    Dr. Lolly Mustache  [phone]   [facility]  [employer] [facility] Dept. 1) [address] [place] 2) [address], [place] 3)  [address], [place] [phone]  [phone]   [facility] Child Abuse Hotline 678-165-0348 or [phone] (After Hours)

## 2015-05-08 NOTE — ED Notes (Signed)
Per pt, states cold symptoms since yesterday, cough, chills

## 2015-05-11 LAB — CULTURE, GROUP A STREP: STREP A CULTURE: NEGATIVE

## 2016-01-07 ENCOUNTER — Encounter (HOSPITAL_COMMUNITY): Payer: Self-pay | Admitting: Emergency Medicine

## 2016-01-07 ENCOUNTER — Emergency Department (HOSPITAL_COMMUNITY)
Admission: EM | Admit: 2016-01-07 | Discharge: 2016-01-07 | Disposition: A | Discharge status: Home / Self Care | Payer: No Typology Code available for payment source | Attending: Dermatology | Admitting: Dermatology

## 2016-01-07 DIAGNOSIS — H5712 Ocular pain, left eye: Secondary | ICD-10-CM | POA: Insufficient documentation

## 2016-01-07 DIAGNOSIS — Z5321 Procedure and treatment not carried out due to patient leaving prior to being seen by health care provider: Secondary | ICD-10-CM | POA: Insufficient documentation

## 2016-01-07 NOTE — ED Notes (Signed)
Pt comes to ed, via pov, c/o states he has something in eye maybe, he reports it was lightly bleeding and it hurts and burns to open the left eye. Pt says he was watching tv and then noticed the eye turning red.

## 2016-01-07 NOTE — ED Notes (Signed)
Pt advised registration that they were leaving and did not want to wait any longer to be seen 

## 2016-10-29 ENCOUNTER — Encounter (HOSPITAL_COMMUNITY): Payer: Self-pay | Admitting: Emergency Medicine

## 2016-10-29 ENCOUNTER — Emergency Department (HOSPITAL_COMMUNITY)
Admission: EM | Admit: 2016-10-29 | Discharge: 2016-10-29 | Disposition: A | Discharge status: Home / Self Care | Payer: BLUE CROSS/BLUE SHIELD | Attending: Emergency Medicine | Admitting: Emergency Medicine

## 2016-10-29 DIAGNOSIS — Z79899 Other long term (current) drug therapy: Secondary | ICD-10-CM | POA: Diagnosis not present

## 2016-10-29 DIAGNOSIS — R6889 Other general symptoms and signs: Secondary | ICD-10-CM

## 2016-10-29 DIAGNOSIS — R52 Pain, unspecified: Secondary | ICD-10-CM | POA: Diagnosis present

## 2016-10-29 DIAGNOSIS — R0981 Nasal congestion: Secondary | ICD-10-CM | POA: Diagnosis not present

## 2016-10-29 DIAGNOSIS — R05 Cough: Secondary | ICD-10-CM | POA: Insufficient documentation

## 2016-10-29 DIAGNOSIS — J029 Acute pharyngitis, unspecified: Secondary | ICD-10-CM | POA: Insufficient documentation

## 2016-10-29 LAB — RAPID STREP SCREEN (MED CTR MEBANE ONLY): STREPTOCOCCUS, GROUP A SCREEN (DIRECT): NEGATIVE

## 2016-10-29 MED ORDER — ACETAMINOPHEN 325 MG PO TABS
650.0000 mg | ORAL_TABLET | Freq: Once | ORAL | Status: AC | PRN
  Administered 2016-10-29: 650 mg via ORAL
  Filled 2016-10-29: qty 2

## 2016-10-29 MED ORDER — LIDOCAINE VISCOUS 2 % MT SOLN: 15.0000 mL | OROMUCOSAL | 0 refills | Status: DC | PRN

## 2016-10-29 MED ORDER — BENZONATATE 100 MG PO CAPS: 100.0000 mg | ORAL_CAPSULE | Freq: Three times a day (TID) | ORAL | 0 refills | Status: DC | PRN

## 2016-10-29 MED ORDER — NAPROXEN 375 MG PO TABS: 375.0000 mg | ORAL_TABLET | Freq: Two times a day (BID) | ORAL | 0 refills | Status: DC

## 2016-10-29 NOTE — Discharge Instructions (Signed)
Naproxen can be taken as directed for relief of headache and muscle aches. Benzonatate can be taken as directed for relief of cough. Liquid lidocaine can be taken as directed for relief of sore throat. Please drink plenty of fluids, such as water, and get plenty of rest. You may also take Tylenol if you develop a fever. Please return to the Emergency Department for new or worsening symptoms. If symptoms persist for more than a week, please call the number on your discharge paperwork to get established with a primary care provider.

## 2016-10-29 NOTE — ED Provider Notes (Signed)
[facility]-EMERGENCY DEPT Provider Note   CSN: [id number] Arrival date & time: 10/29/16  0746  By signing my name below, I, Marnette Burgess Long, attest that this documentation has been prepared under the direction and in the presence of Miata Culbreth A. Itzayana Pardy, PA-C. Electronically Signed: Marnette Burgess Long, Scribe. 10/29/2016. 11:19 AM.   History   Chief Complaint Chief Complaint  Patient presents with  . Sore Throat  . Chills  . Otalgia   The history is provided by the patient and medical records. No language interpreter was used.    HPI Comments:  Bobby Jones is an obese 36 y.o. male with no pertinent PMHx, who presents to the Emergency Department complaining of gradually worsening, generalized myalgias onset last night. Pt reports going to work with feelings of generalized body aches last night that gradually worsened throughout the night leading him to be seen in the ED this morning. Pt has associated symptoms of chills, sore throat, nausea, congestion, dry cough, bilateral otalgia, rhinorrhea, a frontal and occipital HA, and mild SOB that is resolved at this time. He did not try anything PTA for relief of his symptoms. No sick contact with similar symptoms stated, though he states he works [employer] [place] and is around people often. No h/o asthma or COPD. He states no trouble swallowing and is tolerating liquids well. Pt denies fever, vomiting, diarrhea, CP, abdominal pain, rash, and any other complaints at this time. Pt did not receive a seasonal influenza vaccination.    History reviewed. No pertinent past medical history.  There are no active problems to display for this patient.  History reviewed. No pertinent surgical history.  Home Medications    Prior to Admission medications   Medication Sig Start Date End Date Taking? Authorizing Provider  amoxicillin (AMOXIL) 500 MG capsule Take 1 capsule (500 mg total) by mouth 3 (three) times daily. Patient not taking: Reported on 11/29/2014  07/16/12   Roxy Horseman, PA-C  benzonatate (TESSALON PERLES) 100 MG capsule Take 1 capsule (100 mg total) by mouth 3 (three) times daily as needed for cough. 10/29/16   Jaimee Corum A Tahir Blank, PA-C  HYDROcodone-acetaminophen (NORCO) 5-325 MG per tablet Take 1-2 tablets by mouth every 4 (four) hours as needed. 11/29/14   Carmelina Dane, MD  ibuprofen (ADVIL,MOTRIN) 200 MG tablet Take 200 mg by mouth every 6 (six) hours as needed.    Historical Provider, MD  lidocaine (XYLOCAINE) 2 % solution Use as directed 15 mLs in the mouth or throat every 3 (three) hours as needed for mouth pain. 10/29/16   Abaigeal Moomaw A Valor [place], PA-C  methocarbamol (ROBAXIN) 500 MG tablet Take 1 tablet (500 mg total) by mouth 2 (two) times daily. 11/30/14   Junius Finner, PA-C  naproxen (NAPROSYN) 375 MG tablet Take 1 tablet (375 mg total) by mouth 2 (two) times daily. 10/29/16   Genesia Caslin A Doy Taaffe, PA-C  predniSONE (DELTASONE) 20 MG tablet 3 tabs po day one, then 2 po daily x 4 days 11/30/14   Junius Finner, PA-C  traMADol (ULTRAM) 50 MG tablet Take 1 tablet (50 mg total) by mouth every 6 (six) hours as needed. 11/30/14   Junius Finner, PA-C   Family History Family History  Problem Relation Age of Onset  . Diabetes Father   . Hypertension Other   . Diabetes Other   . Coronary artery disease Other    Social History Social History  Substance Use Topics  . Smoking status: Never Smoker  . Smokeless tobacco: Never Used  .  Alcohol use No   Allergies   Patient has no known allergies.  Review of Systems Review of Systems  Constitutional: Positive for chills. Negative for activity change and fever.  HENT: Positive for congestion, ear pain, rhinorrhea and sore throat.   Respiratory: Positive for cough and shortness of breath (resolved).   Cardiovascular: Negative for chest pain.  Gastrointestinal: Positive for nausea. Negative for abdominal pain, diarrhea and vomiting.  Musculoskeletal: Negative for back pain.  Skin: Negative for rash.    Neurological: Positive for headaches.     Physical Exam Updated Vital Signs BP (!) 127/95 (BP Location: Right Arm)   Pulse (!) 102   Temp 99.6 F (37.6 C) (Oral)   Resp 16   SpO2 100%   Physical Exam  Constitutional: He appears well-developed and well-nourished.  HENT:  Head: Normocephalic and atraumatic.  Right Ear: Tympanic membrane normal. No drainage or swelling. Tympanic membrane is not injected, not erythematous and not bulging. No decreased hearing is noted.  Left Ear: Tympanic membrane normal. No drainage or swelling. Tympanic membrane is not injected, not erythematous and not bulging. No decreased hearing is noted.  Nose: Rhinorrhea present.  Mouth/Throat: Uvula is midline. Posterior oropharyngeal erythema (mild) present. No oropharyngeal exudate, posterior oropharyngeal edema or tonsillar abscesses.  Cone of light intact bilaterally   Eyes: Conjunctivae are normal.  Neck: Neck supple.  Cardiovascular: Normal rate, regular rhythm and normal heart sounds.  Exam reveals no gallop and no friction rub.   No murmur heard. Pulmonary/Chest: Effort normal and breath sounds normal. No respiratory distress. He has no wheezes. He has no rales.  Clear to auscultation bilaterally.   Abdominal: Soft. He exhibits no distension. There is no tenderness. There is no guarding.  Musculoskeletal: He exhibits no edema.  Lymphadenopathy:    He has cervical adenopathy (anterior ).  Neurological: He is alert.  Skin: Skin is warm and dry.  Psychiatric: His behavior is normal.  Nursing note and vitals reviewed.    ED Treatments / Results  DIAGNOSTIC STUDIES:  Oxygen Saturation is 100% on RA, normal by my interpretation.    COORDINATION OF CARE:  11:19 AM Discussed treatment plan with pt at bedside including rapid antigen test, group A strep culture, acetaminophen, Tessalon, lidocaine spray, and pt agreed to plan.  Labs (all labs ordered are listed, but only abnormal results are  displayed) Labs Reviewed  RAPID STREP SCREEN (NOT AT [facility])  CULTURE, GROUP A STREP [facility])    EKG  EKG Interpretation None       Radiology No results found.  Procedures Procedures (including critical care time)  Medications Ordered in ED Medications  acetaminophen (TYLENOL) tablet 650 mg (650 mg Oral Given 10/29/16 0857)     Initial Impression / Assessment and Plan / ED Course  I have reviewed the triage vital signs and the nursing notes.  Pertinent labs & imaging results that were available during my care of the patient were reviewed by me and considered in my medical decision making (see chart for details).    Patient with symptoms consistent with influenza.  Vitals are stable, no fever.  No signs of dehydration, tolerating POs.  Lungs are clear. Due to patient's presentation and physical exam a chest x-ray was not ordered bc likely diagnosis of flu.  Discussed the cost versus benefit of Tamiflu treatment with the patient.  The patient understands that symptoms are greater than the recommended 24-48 hour window of treatment.  Patient will be discharged with instructions to orally  hydrate, rest, and medications for symptomatic treatment such as anti-inflammatories ibuprofen and Aleve for muscle aches, Tylenol for fever, and viscous lidocaine for sore throat.  Patient will also be given a cough suppressant. Discussed return precautions to the ED. The patient acknowledges the pain and is agreeable at this time.   Final Clinical Impressions(s) / ED Diagnoses   Final diagnoses:  Flu-like symptoms    New Prescriptions Discharge Medication List as of 10/29/2016 11:37 AM    START taking these medications   Details  benzonatate (TESSALON PERLES) 100 MG capsule Take 1 capsule (100 mg total) by mouth 3 (three) times daily as needed for cough., Starting Tue 10/29/2016, Print    lidocaine (XYLOCAINE) 2 % solution Use as directed 15 mLs in the mouth or throat every 3 (three) hours as  needed for mouth pain., Starting Tue 10/29/2016, Print    naproxen (NAPROSYN) 375 MG tablet Take 1 tablet (375 mg total) by mouth 2 (two) times daily., Starting Tue 10/29/2016, Print       I personally performed the services described in this documentation, which was scribed in my presence. The recorded information has been reviewed and is accurate.     Barkley Boards, PA-C 10/31/16 1616    Shaune Pollack, MD 11/02/16 1149

## 2016-10-29 NOTE — ED Triage Notes (Signed)
Pt c/o sore throat and bilateral ear pain since last night. Pt c/o chills and generalized body aches.

## 2016-10-29 NOTE — ED Notes (Signed)
Bed: WTR9 Expected date:  Expected time:  Means of arrival:  Comments: 

## 2016-10-31 ENCOUNTER — Emergency Department (HOSPITAL_COMMUNITY)
Admission: EM | Admit: 2016-10-31 | Discharge: 2016-10-31 | Disposition: A | Discharge status: Home / Self Care | Payer: BLUE CROSS/BLUE SHIELD | Attending: Emergency Medicine | Admitting: Emergency Medicine

## 2016-10-31 ENCOUNTER — Encounter (HOSPITAL_COMMUNITY): Payer: Self-pay | Admitting: Emergency Medicine

## 2016-10-31 DIAGNOSIS — J029 Acute pharyngitis, unspecified: Secondary | ICD-10-CM | POA: Diagnosis not present

## 2016-10-31 DIAGNOSIS — R05 Cough: Secondary | ICD-10-CM | POA: Diagnosis not present

## 2016-10-31 DIAGNOSIS — M791 Myalgia: Secondary | ICD-10-CM | POA: Insufficient documentation

## 2016-10-31 DIAGNOSIS — R6889 Other general symptoms and signs: Secondary | ICD-10-CM

## 2016-10-31 LAB — CULTURE, GROUP A STREP (THRC)

## 2016-10-31 NOTE — ED Notes (Signed)
Bed: [facility] Expected date:  Expected time:  Means of arrival:  Comments: 

## 2016-10-31 NOTE — ED Provider Notes (Signed)
[facility]ER[facility]Y DEPT Provider Note   CSN: [id number] Arrival date & time: 10/31/16  8841   By signing my name below, I, Teofilo Pod, attest that this documentation has been prepared under the direction and in the presence of [address] [employer]. Electronically Signed: Teofilo Pod, ED Scribe. 10/31/2016. 6:53 PM.   History   Chief Complaint Chief Complaint  Patient presents with  . Work Note    The history is provided by the patient and medical records. No language interpreter was used.   HPI Comments:  Bobby Jones is a 36 y.o. male who presents to the Emergency Department, here requesting a work note after being evaluated here 2 days ago. Pt was seen on 10/29/16 for sore throat, congestion, dry cough, chills, nausea, and generalized body aches and was discharged after a negative strep test with lidocaine and tessalon pearls. He states that his symptoms have remained constant for 2 days, but are improving; denies any worsening. Is only here requesting a work note, does not feel he needs to be evaluated again. Denies fevers, drooling, CP, SOB, abd pain, V/D/C, hematuria, dysuria, arthralgias, numbness, tingling, focal weakness, or any other complaints at this time.   History reviewed. No pertinent past medical history.  There are no active problems to display for this patient.   History reviewed. No pertinent surgical history.     Home Medications    Prior to Admission medications   Medication Sig Start Date End Date Taking? Authorizing Provider  amoxicillin (AMOXIL) 500 MG capsule Take 1 capsule (500 mg total) by mouth 3 (three) times daily. Patient not taking: Reported on 11/29/2014 07/16/12   Roxy Horseman, PA-C  benzonatate (TESSALON PERLES) 100 MG capsule Take 1 capsule (100 mg total) by mouth 3 (three) times daily as needed for cough. 10/29/16   Mia A McDonald, PA-C  HYDROcodone-acetaminophen (NORCO) 5-325 MG per tablet Take 1-2 tablets by mouth every 4 (four)  hours as needed. 11/29/14   Carmelina Dane, MD  ibuprofen (ADVIL,MOTRIN) 200 MG tablet Take 200 mg by mouth every 6 (six) hours as needed.    Historical Provider, MD  lidocaine (XYLOCAINE) 2 % solution Use as directed 15 mLs in the mouth or throat every 3 (three) hours as needed for mouth pain. 10/29/16   Mia A McDonald, PA-C  methocarbamol (ROBAXIN) 500 MG tablet Take 1 tablet (500 mg total) by mouth 2 (two) times daily. 11/30/14   Junius Finner, PA-C  naproxen (NAPROSYN) 375 MG tablet Take 1 tablet (375 mg total) by mouth 2 (two) times daily. 10/29/16   Mia A McDonald, PA-C  predniSONE (DELTASONE) 20 MG tablet 3 tabs po day one, then 2 po daily x 4 days 11/30/14   Junius Finner, PA-C  traMADol (ULTRAM) 50 MG tablet Take 1 tablet (50 mg total) by mouth every 6 (six) hours as needed. 11/30/14   Junius Finner, PA-C    Family History Family History  Problem Relation Age of Onset  . Diabetes Father   . Hypertension Other   . Diabetes Other   . Coronary artery disease Other     Social History Social History  Substance Use Topics  . Smoking status: Never Smoker  . Smokeless tobacco: Never Used  . Alcohol use No     Allergies   Patient has no known allergies.   Review of Systems Review of Systems  Constitutional: Positive for chills. Negative for fever.  HENT: Positive for congestion and sore throat. Negative for drooling.  Respiratory: Positive for cough. Negative for shortness of breath.   Cardiovascular: Negative for chest pain.  Gastrointestinal: Negative for abdominal pain, constipation, diarrhea, nausea and vomiting.  Genitourinary: Negative for dysuria and hematuria.  Musculoskeletal: Positive for myalgias. Negative for arthralgias.  Skin: Negative for color change.  Allergic/Immunologic: Negative for immunocompromised state.  Neurological: Negative for weakness and numbness.  Psychiatric/Behavioral: Negative for confusion.   All systems reviewed and are negative for  acute change except as noted in the HPI.   Physical Exam Updated Vital Signs BP (!) 136/93 (BP Location: Left Arm)   Pulse (!) 109   Temp 98.5 F (36.9 C) (Oral)   Resp 20   SpO2 96%   Physical Exam  Constitutional: He is oriented to person, place, and time. Vital signs are normal. He appears well-developed and well-nourished.  Non-toxic appearance. No distress.  Afebrile, nontoxic, NAD  HENT:  Head: Normocephalic and atraumatic.  Mouth/Throat: Oropharynx is clear and moist and mucous membranes are normal.  Eyes: Conjunctivae and EOM are normal. Right eye exhibits no discharge. Left eye exhibits no discharge.  Neck: Normal range of motion. Neck supple.  Cardiovascular: Normal rate, regular rhythm, normal heart sounds and intact distal pulses.  Exam reveals no gallop and no friction rub.   No murmur heard. Tachycardic in triage, resolved on exam  Pulmonary/Chest: Effort normal and breath sounds normal. No respiratory distress. He has no decreased breath sounds. He has no wheezes. He has no rhonchi. He has no rales.  CTAB in all lung fields, no w/r/r, no hypoxia or increased WOB, speaking in full sentences, SpO2 96% on RA  Abdominal: Soft. Normal appearance and bowel sounds are normal. He exhibits no distension. There is no tenderness. There is no rigidity, no rebound, no guarding, no CVA tenderness, no tenderness at McBurney's point and negative Murphy's sign.  Soft, NTND, +BS throughout, no r/g/r, neg murphy's, neg mcburney's, no CVA TTP   Musculoskeletal: Normal range of motion.  Neurological: He is alert and oriented to person, place, and time. He has normal strength. No sensory deficit.  Skin: Skin is warm, dry and intact. No rash noted.  Psychiatric: He has a normal mood and affect.  Nursing note and vitals reviewed.    ED Treatments / Results  DIAGNOSTIC STUDIES:  Oxygen Saturation is 96% on RA, normal by my interpretation.    COORDINATION OF CARE:  6:52 PM Discussed  treatment plan with pt at bedside and pt agreed to plan.   Labs (all labs ordered are listed, but only abnormal results are displayed) Labs Reviewed - No data to display  EKG  EKG Interpretation None       Radiology No results found.  Procedures Procedures (including critical care time)  Medications Ordered in ED Medications - No data to display   Initial Impression / Assessment and Plan / ED Course  I have reviewed the triage vital signs and the nursing notes.  Pertinent labs & imaging results that were available during my care of the patient were reviewed by me and considered in my medical decision making (see chart for details).     36 y.o. male here for work note. Has URI symptoms, seen 2 days ago in ED, denies any worsening and actually feels improved. Clear lungs, no concerning exam findings, doubt need for emergent work up today. Work note given, advised OTC remedies for symptom control and f/up with PCP in 1wk. I explained the diagnosis and have given explicit precautions to return to  the ER including for any other new or worsening symptoms. The patient understands and accepts the medical plan as it's been dictated and I have answered their questions. Discharge instructions concerning home care and prescriptions have been given. The patient is STABLE and is discharged to home in good condition.   I personally performed the services described in this documentation, which was scribed in my presence. The recorded information has been reviewed and is accurate.    Final Clinical Impressions(s) / ED Diagnoses   Final diagnoses:  Flu-like symptoms    New Prescriptions New Prescriptions   No medications on file     [address], PA-C 10/31/16 1902    Doug Sou, MD 11/01/16 [id number]

## 2016-10-31 NOTE — Discharge Instructions (Signed)
Continue to stay well-hydrated. Gargle warm salt water and spit it out. Use chloraseptic spray as needed for sore throat. Continue to alternate between Tylenol and Ibuprofen for pain or fever. Use Mucinex for cough suppression/expectoration of mucus. Use netipot and flonase to help with nasal congestion. May consider over-the-counter Benadryl or other antihistamine to decrease secretions and for help with your symptoms. Followup with your primary care doctor in 5-7 days for recheck of ongoing symptoms. Return to emergency department for emergent changing or worsening of symptoms.  °

## 2016-10-31 NOTE — ED Triage Notes (Signed)
Pt c/o sore throat, bilateral ear pain, chills, generalized body aches onset Monday night, seen in ED on Tuesday morning. Wants work note for Friday and Saturday. Pt states he does not need to be evaluated, only wants work note.

## 2017-02-19 ENCOUNTER — Emergency Department (HOSPITAL_COMMUNITY)
Admission: EM | Admit: 2017-02-19 | Discharge: 2017-02-19 | Disposition: A | Discharge status: Home / Self Care | Payer: BLUE CROSS/BLUE SHIELD | Attending: Emergency Medicine | Admitting: Emergency Medicine

## 2017-02-19 ENCOUNTER — Encounter (HOSPITAL_COMMUNITY): Payer: Self-pay | Admitting: Emergency Medicine

## 2017-02-19 DIAGNOSIS — R21 Rash and other nonspecific skin eruption: Secondary | ICD-10-CM | POA: Diagnosis present

## 2017-02-19 DIAGNOSIS — L0100 Impetigo, unspecified: Secondary | ICD-10-CM | POA: Diagnosis not present

## 2017-02-19 DIAGNOSIS — Z79899 Other long term (current) drug therapy: Secondary | ICD-10-CM | POA: Diagnosis not present

## 2017-02-19 MED ORDER — MUPIROCIN CALCIUM 2 % EX CREA: 1.0000 "application " | TOPICAL_CREAM | Freq: Two times a day (BID) | CUTANEOUS | 0 refills | Status: DC

## 2017-02-19 NOTE — ED Notes (Signed)
Bed: WTR6 Expected date:  Expected time:  Means of arrival:  Comments: 

## 2017-02-19 NOTE — ED Triage Notes (Signed)
Patient states that he went white water rafting on Saturday and noticed Saturday night his upper lip was itching and small bumps above upper lip.  Patient reports "my momma told me to put Neosporin on it, so I did". Patient reports bumps got larger Sunday and started draining yellowish pus like substance on Monday.patient still has itching.  Patient googled symptoms and got very worried since was same place that male died from bacteria in water.

## 2017-02-19 NOTE — ED Provider Notes (Signed)
[facility]-EMERGENCY DEPT Provider Note   CSN: [id number] Arrival date & time: 02/19/17  0801     History   Chief Complaint Chief Complaint  Patient presents with  . Rash    HPI Bobby RidgeCarlos Jones is a 36 y.o. male.  The history is provided by the patient.  Rash   This is a new problem. The current episode started more than 2 days ago. The problem has not changed since onset.The problem is associated with an unknown (after white water rafting) factor. There has been no fever. The pain is mild. The pain has been constant since onset. Associated symptoms include blisters, itching, pain and weeping. The treatment provided no relief.   36 year old male who presents with rash to upper lip. Onset 3 days ago after white water rafting. Associated with mild blistering and drainage of clear yellow fluid. Not improved with neosporin. No fever or chills.   History reviewed. No pertinent past medical history.  There are no active problems to display for this patient.   History reviewed. No pertinent surgical history.     Home Medications    Prior to Admission medications   Medication Sig Start Date End Date Taking? Authorizing Provider  amoxicillin (AMOXIL) 500 MG capsule Take 1 capsule (500 mg total) by mouth 3 (three) times daily. Patient not taking: Reported on 11/29/2014 07/16/12   Roxy HorsemanBrowning, Robert, PA-C  benzonatate (TESSALON PERLES) 100 MG capsule Take 1 capsule (100 mg total) by mouth 3 (three) times daily as needed for cough. 10/29/16   McDonald, Mia A, PA-C  HYDROcodone-acetaminophen (NORCO) 5-325 MG per tablet Take 1-2 tablets by mouth every 4 (four) hours as needed. 11/29/14   Carmelina DaneAnderson, Jeffery S, MD  ibuprofen (ADVIL,MOTRIN) 200 MG tablet Take 200 mg by mouth every 6 (six) hours as needed.    [provider]  lidocaine (XYLOCAINE) 2 % solution Use as directed 15 mLs in the mouth or throat every 3 (three) hours as needed for mouth pain. 10/29/16   McDonald, Mia A, PA-C  methocarbamol  (ROBAXIN) 500 MG tablet Take 1 tablet (500 mg total) by mouth 2 (two) times daily. 11/30/14   Lurene ShadowPhelps, Erin O, PA-C  mupirocin cream (BACTROBAN) 2 % Apply 1 application topically 2 (two) times daily. 02/19/17   Lavera GuiseLiu, Yailyn Strack Duo, MD  naproxen (NAPROSYN) 375 MG tablet Take 1 tablet (375 mg total) by mouth 2 (two) times daily. 10/29/16   McDonald, Mia A, PA-C  predniSONE (DELTASONE) 20 MG tablet 3 tabs po day one, then 2 po daily x 4 days 11/30/14   Lurene ShadowPhelps, Erin O, PA-C  traMADol (ULTRAM) 50 MG tablet Take 1 tablet (50 mg total) by mouth every 6 (six) hours as needed. 11/30/14   Lurene ShadowPhelps, Erin O, PA-C    Family History Family History  Problem Relation Age of Onset  . Diabetes Father   . Hypertension Other   . Diabetes Other   . Coronary artery disease Other     Social History Social History  Substance Use Topics  . Smoking status: Never Smoker  . Smokeless tobacco: Never Used  . Alcohol use No     Allergies   Patient has no known allergies.   Review of Systems Review of Systems  Constitutional: Negative for fever.  Respiratory: Negative for shortness of breath.   Cardiovascular: Negative for chest pain.  Gastrointestinal: Negative for abdominal pain, nausea and vomiting.  Skin: Positive for itching and rash.  Allergic/Immunologic: Negative for immunocompromised state.  Hematological: Does not  bruise/bleed easily.     Physical Exam Updated Vital Signs BP (!) 156/106 (BP Location: Right Arm)   Pulse (!) 103   Temp 98.8 F (37.1 C) (Oral)   Resp 18   Ht 6\' 2"  (1.88 m)   Wt 120.2 kg (265 lb)   SpO2 100%   BMI 34.02 kg/m   Physical Exam Physical Exam  Constitutional: Appears well-developed and well-nourished. No acute distress. HENT:  Head: Normocephalic.  Eyes: Conjunctivae are normal.  Cardiovascular: Normal rate and intact distal pulses.   Pulmonary/Chest: Effort normal. No respiratory distress.  Abdominal: Exhibits no distension.  Musculoskeletal: Normal range of  motion. Exhibits no deformity.  Neurological: Alert. Fluent speech.  Skin: Skin is warm and dry. Mild papular/vesicular lesions to filtrum of upper lip with yellow discharge/crusting.  Psychiatric: Normal mood and affect. Behavior is normal.  Nursing note and vitals reviewed.   ED Treatments / Results  Labs (all labs ordered are listed, but only abnormal results are displayed) Labs Reviewed - No data to display  EKG  EKG Interpretation None       Radiology No results found.  Procedures Procedures (including critical care time)  Medications Ordered in ED Medications - No data to display   Initial Impression / Assessment and Plan / ED Course  I have reviewed the triage vital signs and the nursing notes.  Pertinent labs & imaging results that were available during my care of the patient were reviewed by me and considered in my medical decision making (see chart for details).     Appears to be impetigo. Will prescribe course of mupirocin. Does not appear to be HSV in nature at this time. Strict return and follow-up instructions reviewed. He expressed understanding of all discharge instructions and felt comfortable with the plan of care.   Final Clinical Impressions(s) / ED Diagnoses   Final diagnoses:  Impetigo    New Prescriptions New Prescriptions   MUPIROCIN CREAM (BACTROBAN) 2 %    Apply 1 application topically 2 (two) times daily.     Lavera Guise, MD 02/19/17 1027

## 2017-02-19 NOTE — Discharge Instructions (Signed)
Your symptoms seem consistent with local skin infection Use antibiotic ointment as prescribed Return for worsening symptoms, including spreading rash, fever, escalating pain, facial swelling or any other symptoms concerning ot you.

## 2018-06-09 ENCOUNTER — Emergency Department (HOSPITAL_COMMUNITY)
Admission: EM | Admit: 2018-06-09 | Discharge: 2018-06-09 | Disposition: A | Discharge status: Home / Self Care | Payer: BLUE CROSS/BLUE SHIELD | Attending: Emergency Medicine | Admitting: Emergency Medicine

## 2018-06-09 ENCOUNTER — Encounter (HOSPITAL_COMMUNITY): Payer: Self-pay

## 2018-06-09 DIAGNOSIS — M545 Low back pain: Secondary | ICD-10-CM | POA: Diagnosis present

## 2018-06-09 DIAGNOSIS — M5442 Lumbago with sciatica, left side: Secondary | ICD-10-CM | POA: Insufficient documentation

## 2018-06-09 DIAGNOSIS — M5441 Lumbago with sciatica, right side: Secondary | ICD-10-CM

## 2018-06-09 DIAGNOSIS — M62830 Muscle spasm of back: Secondary | ICD-10-CM | POA: Diagnosis not present

## 2018-06-09 MED ORDER — CYCLOBENZAPRINE HCL 10 MG PO TABS: 10.0000 mg | ORAL_TABLET | Freq: Two times a day (BID) | ORAL | 0 refills | Status: DC | PRN

## 2018-06-09 MED ORDER — PREDNISONE 10 MG PO TABS: 50.0000 mg | ORAL_TABLET | Freq: Every day | ORAL | 0 refills | Status: AC

## 2018-06-09 MED ORDER — KETOROLAC TROMETHAMINE 30 MG/ML IJ SOLN
30.0000 mg | Freq: Once | INTRAMUSCULAR | Status: AC
  Administered 2018-06-09: 30 mg via INTRAVENOUS
  Filled 2018-06-09: qty 1

## 2018-06-09 MED ORDER — CYCLOBENZAPRINE HCL 10 MG PO TABS
10.0000 mg | ORAL_TABLET | Freq: Once | ORAL | Status: AC
  Administered 2018-06-09: 10 mg via ORAL
  Filled 2018-06-09: qty 1

## 2018-06-09 NOTE — ED Provider Notes (Signed)
[facility]-EMERGENCY DEPT Provider Note   CSN: [id number] Arrival date & time: 06/09/18  1052     History   Chief Complaint Chief Complaint  Patient presents with  . Back Pain    HPI Bobby Jones is a 37 y.o. male with no significant past medical history presents for evaluation of acute onset, gradually worsening low back pain since yesterday at around 10 AM.  He denies any recent trauma or falls but does note that he is on his feet all day and does a lot of heavy lifting at work.  Also notes his mother has chronic back problems.  Pain occurs with prolonged positioning and bending.  Pain is localized to the low back, radiates down the lateral aspect of the bilateral lower extremities.  Pain is sharp.  He denies any numbness, tingling, weakness, bowel or bladder incontinence, saddle anesthesia, urinary symptoms, fevers, or history of IV drug use or cancer.  He has taken some of his mother's Flexeril with some improvement.   The history is provided by the patient.    History reviewed. No pertinent past medical history.  There are no active problems to display for this patient.   History reviewed. No pertinent surgical history.      Home Medications    Prior to Admission medications   Medication Sig Start Date End Date Taking? Authorizing Provider  amoxicillin (AMOXIL) 500 MG capsule Take 1 capsule (500 mg total) by mouth 3 (three) times daily. Patient not taking: Reported on 11/29/2014 07/16/12   Roxy Horseman, PA-C  benzonatate (TESSALON PERLES) 100 MG capsule Take 1 capsule (100 mg total) by mouth 3 (three) times daily as needed for cough. 10/29/16   McDonald, Mia A, PA-C  cyclobenzaprine (FLEXERIL) 10 MG tablet Take 1 tablet (10 mg total) by mouth 2 (two) times daily as needed for muscle spasms. 06/09/18   Donna Silverman A, PA-C  HYDROcodone-acetaminophen (NORCO) 5-325 MG per tablet Take 1-2 tablets by mouth every 4 (four) hours as needed. 11/29/14    Carmelina Dane, MD  ibuprofen (ADVIL,MOTRIN) 200 MG tablet Take 200 mg by mouth every 6 (six) hours as needed.    [provider]  lidocaine (XYLOCAINE) 2 % solution Use as directed 15 mLs in the mouth or throat every 3 (three) hours as needed for mouth pain. 10/29/16   McDonald, Mia A, PA-C  methocarbamol (ROBAXIN) 500 MG tablet Take 1 tablet (500 mg total) by mouth 2 (two) times daily. 11/30/14   Lurene Shadow, PA-C  mupirocin cream (BACTROBAN) 2 % Apply 1 application topically 2 (two) times daily. 02/19/17   Lavera Guise, MD  naproxen (NAPROSYN) 375 MG tablet Take 1 tablet (375 mg total) by mouth 2 (two) times daily. 10/29/16   McDonald, Mia A, PA-C  predniSONE (DELTASONE) 10 MG tablet Take 5 tablets (50 mg total) by mouth daily for 5 days. 06/09/18 06/14/18  Michela Pitcher A, PA-C  traMADol (ULTRAM) 50 MG tablet Take 1 tablet (50 mg total) by mouth every 6 (six) hours as needed. 11/30/14   Lurene Shadow, PA-C    Family History Family History  Problem Relation Age of Onset  . Diabetes Father   . Hypertension Other   . Diabetes Other   . Coronary artery disease Other     Social History Social History   Tobacco Use  . Smoking status: Never Smoker  . Smokeless tobacco: Never Used  Substance Use Topics  . Alcohol use: No  .  Drug use: No     Allergies   Patient has no known allergies.   Review of Systems Review of Systems  Constitutional: Negative for fever.  Gastrointestinal: Negative for abdominal pain, nausea and vomiting.  Genitourinary: Negative for difficulty urinating, dysuria and hematuria.  Musculoskeletal: Positive for back pain.  Neurological: Negative for weakness and numbness.     Physical Exam Updated Vital Signs BP (!) 146/101 (BP Location: Right Arm)   Pulse 98   Temp 99.4 F (37.4 C) (Oral)   Resp 18   Ht 6\' 2"  (1.88 m)   Wt 113.4 kg   SpO2 100%   BMI 32.10 kg/m   Physical Exam  Constitutional: He appears well-developed and  well-nourished. No distress.  Appears uncomfortable in chair  HENT:  Head: Normocephalic and atraumatic.  Eyes: Conjunctivae are normal. Right eye exhibits no discharge. Left eye exhibits no discharge.  Neck: No JVD present. No tracheal deviation present.  Cardiovascular: Intact distal pulses.  2+ radial and DP/PT pulses bilaterally, Homans sign absent bilaterally, no lower extremity edema, no palpable cords, compartments are soft   Pulmonary/Chest: Effort normal.  Abdominal: He exhibits no distension.  Musculoskeletal: He exhibits no edema.  Tenderness to palpation of the lumbar region at the L4-S1 levels with bilateral paralumbar muscle tenderness and spasm at this level.  No deformity, crepitus, or step-off noted.  No focal tenderness.  No tenderness to palpation of the hips.  Bilateral SI joint tenderness noted.  Bilateral straight leg raise.  5/5 strength of BLE major muscle groups.  Decreased range of motion with flexion and extension of the lumbar spine with pain worse with extension.  Neurological: He is alert.  Fluent speech, no facial droop, sensation intact to soft touch of bilateral lower extremities.  Ambulates with an antalgic gait but is able to Heel Walk and Toe Walk without difficulty and exhibits good balance.  Skin: Skin is warm and dry. No erythema.  Psychiatric: He has a normal mood and affect. His behavior is normal.  Nursing note and vitals reviewed.    ED Treatments / Results  Labs (all labs ordered are listed, but only abnormal results are displayed) Labs Reviewed - No data to display  EKG None  Radiology No results found.  Procedures Procedures (including critical care time)  Medications Ordered in ED Medications  cyclobenzaprine (FLEXERIL) tablet 10 mg (10 mg Oral Given 06/09/18 1400)  ketorolac (TORADOL) 30 MG/ML injection 30 mg (30 mg Intravenous Given 06/09/18 1403)     Initial Impression / Assessment and Plan / ED Course  I have reviewed the  triage vital signs and the nursing notes.  Pertinent labs & imaging results that were available during my care of the patient were reviewed by me and considered in my medical decision making (see chart for details).     Patient with back pain since yesterday with radiculopathy bilaterally.  He is afebrile, mildly tachycardic but based on chart review this is his baseline and he does appear uncomfortable which could be contributing to his vital signs.  It did resolve on re-evaluation. No neurological deficits and normal neuro exam.  Patient can walk but states is painful.  No loss of bowel or bladder control.  No concern for cauda equina.  No fever, night sweats, weight loss, h/o cancer, IVDU.  Doubt nephrolithiasis, dissection, spinal abscess, or DVT.  RICE protocol and pain medicine indicated and discussed with patient.  Will discharge with prednisone burst and Flexeril.  Advised of appropriate use  of these medications.  Recommend follow with PCP or orthopedist for reevaluation of symptoms.  Discussed strict ED return precautions.  Patient and mother verbalized understanding of and agreement with plan and patient is stable for discharge home at this time.   Final Clinical Impressions(s) / ED Diagnoses   Final diagnoses:  Acute bilateral low back pain with bilateral sciatica  Muscle spasm of back    ED Discharge Orders         Ordered    cyclobenzaprine (FLEXERIL) 10 MG tablet  2 times daily PRN     06/09/18 1350    predniSONE (DELTASONE) 10 MG tablet  Daily     06/09/18 1350           Jeanie SewerFawze, Janayla Marik A, PA-C 06/09/18 1533    Jacalyn LefevreHaviland, Julie, MD 06/09/18 1541

## 2018-06-09 NOTE — ED Triage Notes (Signed)
Pt presents with c/o lower back pain that started yesterday. Denies any injury to that part of his back.

## 2018-06-09 NOTE — ED Notes (Signed)
Bed: WTR9 Expected date:  Expected time:  Means of arrival:  Comments: 

## 2018-06-09 NOTE — Discharge Instructions (Signed)
1. Medications: Take steroid as prescribed with food to avoid upset stomach issues.  Do not take ibuprofen, Advil, Aleve, or Motrin while taking this medicine.  You may take [phone] mg of Tylenol every 6 hours as needed for pain. Do not exceed 4000 mg of Tylenol daily.  When you have completed the steroid, if you are still feeling severe back pain you can alternate 600 mg of ibuprofen with food and [phone] mg of Tylenol every 3 hours as needed for pain. Do not exceed 4000 mg of Tylenol daily. You can take Flexeril as needed for muscle relaxation but this medication may make you drowsy so do not drive, drink alcohol, operate heavy machinery, or make important decisions while you are using this medicine.  I typically recommend only taking this medicine at night.  You can also cut these tablets in half if they are very strong.  2. Treatment: rest, ice or heat therapy (whichever feels best - 20 minutes at a time 3 times daily), drink plenty of fluids, gentle stretching (see attached exercises, but you can also YouTube search low back/lumbar physical therapy exercises).   3. Follow Up: Please followup with orthopedics as directed or your PCP in 1 week if no improvement for discussion of your diagnoses and further evaluation after today's visit; if you do not have a primary care doctor use the resource guide provided to find one; Please return to the ER for worsening symptoms or other concerns such as worsening pain, incontinence, redness of the skin, fevers, loss of pulses, or loss of feeling

## 2018-06-12 ENCOUNTER — Emergency Department (HOSPITAL_COMMUNITY)
Admission: EM | Admit: 2018-06-12 | Discharge: 2018-06-12 | Discharge status: Against Medical Advice | Payer: BLUE CROSS/BLUE SHIELD | Attending: Emergency Medicine | Admitting: Emergency Medicine

## 2018-06-12 ENCOUNTER — Encounter (HOSPITAL_COMMUNITY): Payer: Self-pay | Admitting: *Deleted

## 2018-06-12 ENCOUNTER — Other Ambulatory Visit: Payer: Self-pay

## 2018-06-12 DIAGNOSIS — M545 Low back pain, unspecified: Secondary | ICD-10-CM

## 2018-06-12 DIAGNOSIS — R000 Tachycardia, unspecified: Secondary | ICD-10-CM | POA: Insufficient documentation

## 2018-06-12 MED ORDER — KETOROLAC TROMETHAMINE 60 MG/2ML IM SOLN
60.0000 mg | Freq: Once | INTRAMUSCULAR | Status: AC
  Administered 2018-06-12: 60 mg via INTRAMUSCULAR
  Filled 2018-06-12: qty 2

## 2018-06-12 MED ORDER — ACETAMINOPHEN 500 MG PO TABS
1000.0000 mg | ORAL_TABLET | Freq: Once | ORAL | Status: AC
  Administered 2018-06-12: 1000 mg via ORAL
  Filled 2018-06-12: qty 2

## 2018-06-12 NOTE — Discharge Instructions (Signed)
You can take Tylenol or Ibuprofen as directed for pain. You can alternate Tylenol and Ibuprofen every 4 hours. If you take Tylenol at 1pm, then you can take Ibuprofen at 5pm. Then you can take Tylenol again at 9pm.   Continue doing supportive care measures that you received last time you are here.  Return to the Emergency Department immediately for any worsening back pain, neck pain, difficulty walking, numbness/weaknss of your arms or legs, urinary or bowel accidents, fever or any other worsening or concerning symptoms.

## 2018-06-12 NOTE — ED Provider Notes (Signed)
[facility]-EMERGENCY DEPT Provider Note   CSN: [id number] Arrival date & time: 06/12/18  1450     History   Chief Complaint Chief Complaint  Patient presents with  . Back Pain    HPI Bobby Jones is a 37 y.o. male with no significant past medical history who presents for evaluation of continued back pain. He was seen here on 06/09/18 for evaluation of back pain.  He does report that he does a lot of heavy lifting at work.  Reported that his pain is worse with bending and moving.  He comes back in today because he needs a note for work.  He states that he does a lot of heavy lifting for work and thought that he would be able to return today but states he is continued to have pain and needs work note.  He denies any new trauma, injury, fall.  He states his symptoms have not changed or worsened.  He has been taking medications with some improvement but does report the pain continues.  He states it is worse with bending or moving and not do heavy lifting he does not work exacerbates his pain.  Patient denies any fevers, numbness/weakness of his arms or legs, urinary or bowel incontinence, difficulty walking.  The history is provided by the patient.    History reviewed. No pertinent past medical history.  There are no active problems to display for this patient.   History reviewed. No pertinent surgical history.      Home Medications    Prior to Admission medications   Medication Sig Start Date End Date Taking? Authorizing Provider  amoxicillin (AMOXIL) 500 MG capsule Take 1 capsule (500 mg total) by mouth 3 (three) times daily. Patient not taking: Reported on 11/29/2014 07/16/12   Roxy Horseman, PA-C  benzonatate (TESSALON PERLES) 100 MG capsule Take 1 capsule (100 mg total) by mouth 3 (three) times daily as needed for cough. 10/29/16   McDonald, Mia A, PA-C  cyclobenzaprine (FLEXERIL) 10 MG tablet Take 1 tablet (10 mg total) by mouth 2 (two) times daily as  needed for muscle spasms. 06/09/18   Fawze, Mina A, PA-C  HYDROcodone-acetaminophen (NORCO) 5-325 MG per tablet Take 1-2 tablets by mouth every 4 (four) hours as needed. 11/29/14   Carmelina Dane, MD  ibuprofen (ADVIL,MOTRIN) 200 MG tablet Take 200 mg by mouth every 6 (six) hours as needed.    [provider]  lidocaine (XYLOCAINE) 2 % solution Use as directed 15 mLs in the mouth or throat every 3 (three) hours as needed for mouth pain. 10/29/16   McDonald, Mia A, PA-C  methocarbamol (ROBAXIN) 500 MG tablet Take 1 tablet (500 mg total) by mouth 2 (two) times daily. 11/30/14   Lurene Shadow, PA-C  mupirocin cream (BACTROBAN) 2 % Apply 1 application topically 2 (two) times daily. 02/19/17   Lavera Guise, MD  naproxen (NAPROSYN) 375 MG tablet Take 1 tablet (375 mg total) by mouth 2 (two) times daily. 10/29/16   McDonald, Mia A, PA-C  predniSONE (DELTASONE) 10 MG tablet Take 5 tablets (50 mg total) by mouth daily for 5 days. 06/09/18 06/14/18  Michela Pitcher A, PA-C  traMADol (ULTRAM) 50 MG tablet Take 1 tablet (50 mg total) by mouth every 6 (six) hours as needed. 11/30/14   Lurene Shadow, PA-C    Family History Family History  Problem Relation Age of Onset  . Diabetes Father   . Hypertension Other   . Diabetes  Other   . Coronary artery disease Other     Social History Social History   Tobacco Use  . Smoking status: Never Smoker  . Smokeless tobacco: Never Used  Substance Use Topics  . Alcohol use: No  . Drug use: No     Allergies   Patient has no known allergies.   Review of Systems Review of Systems  Constitutional: Negative for fever.  Musculoskeletal: Positive for back pain. Negative for neck pain.  Neurological: Negative for weakness and numbness.  All other systems reviewed and are negative.    Physical Exam Updated Vital Signs BP (!) 141/94 (BP Location: Right Arm)   Pulse (!) 120   Temp 98.1 F (36.7 C) (Oral)   Resp 15   Ht 6\' 2"  (1.88 m)   Wt 113.4 kg    SpO2 100%   BMI 32.10 kg/m   Physical Exam  Constitutional: He appears well-developed and well-nourished.  HENT:  Head: Normocephalic and atraumatic.  Eyes: Conjunctivae and EOM are normal. Right eye exhibits no discharge. Left eye exhibits no discharge. No scleral icterus.  Neck:  Full flexion/extension and lateral movement of neck fully intact. No bony midline tenderness. No deformities or crepitus.   Cardiovascular: Regular rhythm. Tachycardia present.  Pulses:      Radial pulses are 2+ on the right side, and 2+ on the left side.       Dorsalis pedis pulses are 2+ on the right side, and 2+ on the left side.  Pulmonary/Chest: Effort normal.  Lungs clear to auscultation bilaterally.  Symmetric chest rise.  No wheezing, rales, rhonchi.  Musculoskeletal:       Thoracic back: He exhibits no tenderness.       Back:  Diffuse muscular tenderness over the entire lumbar region that extends midline.  No deformity or crepitus noted.  Neurological: He is alert.  Follows commands, Moves all extremities  5/5 strength to BUE and BLE  Sensation intact throughout all major nerve distributions  Skin: Skin is warm and dry.  Psychiatric: He has a normal mood and affect. His speech is normal and behavior is normal.  Nursing note and vitals reviewed.    ED Treatments / Results  Labs (all labs ordered are listed, but only abnormal results are displayed) Labs Reviewed  COMPREHENSIVE METABOLIC PANEL  CBC WITH DIFFERENTIAL/PLATELET  URINALYSIS, ROUTINE W REFLEX MICROSCOPIC    EKG EKG Interpretation  Date/Time:  Friday June 12 2018 19:48:56 EST Ventricular Rate:  129 PR Interval:    QRS Duration: 92 QT Interval:  311 QTC Calculation: 456 R Axis:   -97 Text Interpretation:  Sinus tachycardia Left anterior fascicular block Abnormal R-wave progression, late transition Consider left ventricular hypertrophy Baseline wander in lead(s) V2 Confirmed by Tilden Fossa [phone]) on 06/12/2018  7:59:16 PM   Radiology No results found.  Procedures Procedures (including critical care time)  Medications Ordered in ED Medications  acetaminophen (TYLENOL) tablet 1,000 mg (1,000 mg Oral Given 06/12/18 1702)  ketorolac (TORADOL) injection 60 mg (60 mg Intramuscular Given 06/12/18 1703)     Initial Impression / Assessment and Plan / ED Course  I have reviewed the triage vital signs and the nursing notes.  Pertinent labs & imaging results that were available during my care of the patient were reviewed by me and considered in my medical decision making (see chart for details).     37 year old male who presents for evaluation of low back pain.  Seen here in 06/09/18 for symptoms.  Denies  any new changes or worsening symptoms.  No new trauma, injury, fall.  States he came in today because he needs a work note as his work involves a lot of heavy lifting and that exacerbates his pain.  He states if he is not needed or no, he would not of come in. Patient is afebrile, non-toxic appearing, sitting comfortably on examination table. Vital signs reviewed.  He is tachycardic into the 120s.  Vital signs otherwise stable.  Review of records show the patient is normally tachycardic when he comes into the ED.  Normally he is anywhere between 101-106.  We will plan to treat pain and reassess his heart rate. On exam, he has diffuse muscular tenderness over the entire lumbar region.  Given lack of trauma, injury, fall, no indication for repeat imaging today.  Plan for work note.   Reevaluation after analgesics, patient still tachycardic.  When I went in to evaluate patient, his resting heart rate was between 112 115.  As I discussed with him, his heart rate jumped up to 135-138.  He does state that he feels very nervous and anxious being here in the ED.  He does report that the medication help with his pain.  Question if there is an anxiety component to his vitals.  We will plan to give p.o. fluids, observe  and reassess.    Patient still tachycardic in the ED.  He is currently denying any chest pain, shortness of breath, dysuria, hematuria, abdominal pain, nausea/vomiting.  Discussed with Dr. Madilyn Hookees who recommends obtaining EKG and lab work.  RN informed me that patient wished to discuss lab work.  I discussed with both patient and mom in the room.  Patient states that he does not want to have any lab work done.  He reports he has had bad extremes of the needles and does not wish to be stuck today in the ED.  I discussed with him that leaving prior to work-up being completed would be consider leaving AGAINST MEDICAL ADVICE.  Discussed risk first benefits of leaving AGAINST MEDICAL ADVICE, including but not limited to worse condition, death.  Patient expresses understanding and wishes to leave prior to completion of treatment.  Patient exhibits full medical decision-making capacity.  He does not appear intoxicated.  Patient will be discharged AGAINST MEDICAL ADVICE. Patient had ample opportunity for questions and discussion. All patient's questions were answered with full understanding. Strict return precautions discussed. Patient expresses understanding and agreement to plan.    Final Clinical Impressions(s) / ED Diagnoses   Final diagnoses:  Acute bilateral low back pain, unspecified whether sciatica present    ED Discharge Orders    None       Rosana HoesLayden, Akaila Rambo A, PA-C 06/12/18 2141    Tilden Fossaees, Elizabeth, MD 06/12/18 2356

## 2018-06-12 NOTE — ED Notes (Signed)
Refused IV/ blood draw.

## 2018-06-12 NOTE — ED Triage Notes (Signed)
Pt reports back pain since Monday and was evaluated for on Tuesday.  Pt states he needs MD notes to write him out of work.  Pt a/o x 4 and ambulatory.

## 2018-06-12 NOTE — ED Notes (Signed)
Pt states that he does not want to be stuck now. He would rather speak to the provider again. He states that he is too nervous.

## 2019-02-03 ENCOUNTER — Emergency Department (HOSPITAL_COMMUNITY)
Admission: EM | Admit: 2019-02-03 | Discharge: 2019-02-04 | Disposition: A | Discharge status: Home / Self Care | Payer: BLUE CROSS/BLUE SHIELD | Attending: Emergency Medicine | Admitting: Emergency Medicine

## 2019-02-03 ENCOUNTER — Encounter (HOSPITAL_COMMUNITY): Payer: Self-pay

## 2019-02-03 ENCOUNTER — Other Ambulatory Visit: Payer: Self-pay

## 2019-02-03 DIAGNOSIS — Z79899 Other long term (current) drug therapy: Secondary | ICD-10-CM | POA: Diagnosis not present

## 2019-02-03 DIAGNOSIS — R059 Cough, unspecified: Secondary | ICD-10-CM

## 2019-02-03 DIAGNOSIS — Z20822 Contact with and (suspected) exposure to covid-19: Secondary | ICD-10-CM

## 2019-02-03 DIAGNOSIS — R05 Cough: Secondary | ICD-10-CM | POA: Insufficient documentation

## 2019-02-03 DIAGNOSIS — R030 Elevated blood-pressure reading, without diagnosis of hypertension: Secondary | ICD-10-CM | POA: Diagnosis not present

## 2019-02-03 DIAGNOSIS — Z20828 Contact with and (suspected) exposure to other viral communicable diseases: Secondary | ICD-10-CM | POA: Diagnosis not present

## 2019-02-03 DIAGNOSIS — J3489 Other specified disorders of nose and nasal sinuses: Secondary | ICD-10-CM | POA: Diagnosis not present

## 2019-02-03 DIAGNOSIS — R0981 Nasal congestion: Secondary | ICD-10-CM | POA: Insufficient documentation

## 2019-02-03 NOTE — ED Provider Notes (Signed)
[facility] Note   CSN: [id number] Arrival date & time: 02/03/19  2233  History   Chief Complaint Chief Complaint  Patient presents with  . Work Note  . URI    HPI Robie RidgeCarlos Muff is a 38 y.o. male with no significant past medical history who presents for evaluation of work note.  Patient states he has had nasal congestion and cough over the last 3 days.  He went to work today and was told he needed a COVID test in order to come back.  Cough nonproductive without hemoptysis.  He denies fever, chills, nausea, vomiting, chest pain, shortness of breath, sore throat, difficulty tolerating p.o. intake, abdominal pain, diarrhea, dysuria, redness, swelling or warmth to his lower extremities.  He is not take any medicines at baseline.  He has been using OTC Mucinex with relief of his symptoms.  Patient states "I am just here for my test and work note."  Denies additional aggravating or alleviating factors.  History obtained from patient and past medical records.  No interpreter is used.     HPI  History reviewed. No pertinent past medical history.  There are no active problems to display for this patient.   History reviewed. No pertinent surgical history.      Home Medications    Prior to Admission medications   Medication Sig Start Date End Date Taking? Authorizing Provider  amoxicillin (AMOXIL) 500 MG capsule Take 1 capsule (500 mg total) by mouth 3 (three) times daily. Patient not taking: Reported on 11/29/2014 07/16/12   Roxy HorsemanBrowning, Robert, PA-C  benzonatate (TESSALON PERLES) 100 MG capsule Take 1 capsule (100 mg total) by mouth 3 (three) times daily as needed for cough. 10/29/16   McDonald, Mia A, PA-C  cyclobenzaprine (FLEXERIL) 10 MG tablet Take 1 tablet (10 mg total) by mouth 2 (two) times daily as needed for muscle spasms. 06/09/18   Fawze, Mina A, PA-C  HYDROcodone-acetaminophen (NORCO) 5-325 MG per tablet Take 1-2 tablets by mouth every 4  (four) hours as needed. 11/29/14   Carmelina DaneAnderson, Jeffery S, MD  ibuprofen (ADVIL,MOTRIN) 200 MG tablet Take 200 mg by mouth every 6 (six) hours as needed.    [provider]  lidocaine (XYLOCAINE) 2 % solution Use as directed 15 mLs in the mouth or throat every 3 (three) hours as needed for mouth pain. 10/29/16   McDonald, Mia A, PA-C  methocarbamol (ROBAXIN) 500 MG tablet Take 1 tablet (500 mg total) by mouth 2 (two) times daily. 11/30/14   Lurene ShadowPhelps, Erin O, PA-C  mupirocin cream (BACTROBAN) 2 % Apply 1 application topically 2 (two) times daily. 02/19/17   Lavera GuiseLiu, Dana Duo, MD  naproxen (NAPROSYN) 375 MG tablet Take 1 tablet (375 mg total) by mouth 2 (two) times daily. 10/29/16   McDonald, Mia A, PA-C  traMADol (ULTRAM) 50 MG tablet Take 1 tablet (50 mg total) by mouth every 6 (six) hours as needed. 11/30/14   Lurene ShadowPhelps, Erin O, PA-C    Family History Family History  Problem Relation Age of Onset  . Diabetes Father   . Hypertension Other   . Diabetes Other   . Coronary artery disease Other     Social History Social History   Tobacco Use  . Smoking status: Never Smoker  . Smokeless tobacco: Never Used  Substance Use Topics  . Alcohol use: No  . Drug use: No     Allergies   Patient has no known allergies.   Review of Systems  Review of Systems  Constitutional: Negative.   HENT: Positive for congestion and rhinorrhea. Negative for drooling, ear discharge, ear pain, facial swelling, sinus pressure, sinus pain, sneezing, sore throat, trouble swallowing and voice change.   Respiratory: Positive for cough. Negative for apnea, choking, chest tightness, shortness of breath, wheezing and stridor.   Cardiovascular: Negative.   Gastrointestinal: Negative.   Genitourinary: Negative.   Musculoskeletal: Negative.   Skin: Negative.   Neurological: Negative.   All other systems reviewed and are negative.    Physical Exam Updated Vital Signs BP (!) 160/100   Pulse (!) 109   Temp 99 F (37.2  C) (Oral)   Resp 16   Ht 6\' 2"  (1.88 m)   Wt 115.7 kg   SpO2 97%   BMI 32.74 kg/m   Physical Exam Vitals signs and nursing note reviewed.  Constitutional:      General: He is not in acute distress.    Appearance: He is not ill-appearing, toxic-appearing or diaphoretic.  HENT:     Head: Normocephalic and atraumatic.     Jaw: There is normal jaw occlusion.     Right Ear: Tympanic membrane, ear canal and external ear normal. There is no impacted cerumen. No hemotympanum. Tympanic membrane is not injected, scarred, perforated, erythematous, retracted or bulging.     Left Ear: Tympanic membrane, ear canal and external ear normal. There is no impacted cerumen. No hemotympanum. Tympanic membrane is not injected, scarred, perforated, erythematous, retracted or bulging.     Ears:     Comments: No Mastoid tenderness.    Nose:     Comments: Clear rhinorrhea and congestion to bilateral nares.  No sinus tenderness.    Mouth/Throat:     Comments: Posterior oropharynx clear.  Mucous membranes moist.  Tonsils without erythema or exudate.  Uvula midline without deviation.  No evidence of PTA or RPA.  No drooling, dysphasia or trismus.  Phonation normal. Neck:     Trachea: Trachea and phonation normal.     Meningeal: Brudzinski's sign and Kernig's sign absent.     Comments: No Neck stiffness or neck rigidity.  No meningismus.  No cervical lymphadenopathy. Cardiovascular:     Comments: No murmurs rubs or gallops. Pulmonary:     Comments: Clear to auscultation bilaterally without wheeze, rhonchi or rales.  No accessory muscle usage.  Able speak in full sentences. Abdominal:     Comments: Soft, nontender without rebound or guarding.  No CVA tenderness.  Musculoskeletal:     Comments: Moves all 4 extremities without difficulty.  Lower extremities without edema, erythema or warmth.  Skin:    Comments: Brisk capillary refill.  No rashes or lesions.  Neurological:     Mental Status: He is alert.      Comments: Ambulatory in department without difficulty.  Cranial nerves II through XII grossly intact.  No facial droop.  No aphasia.    ED Treatments / Results  Labs (all labs ordered are listed, but only abnormal results are displayed) Labs Reviewed  NOVEL CORONAVIRUS, NAA (HOSPITAL ORDER, SEND-OUT TO REF LAB)    EKG None  Radiology No results found.  Procedures Procedures (including critical care time)  Medications Ordered in ED Medications - No data to display   Initial Impression / Assessment and Plan / ED Course  I have reviewed the triage vital signs and the nursing notes.  Pertinent labs & imaging results that were available during my care of the patient were reviewed by me and considered in  my medical decision making (see chart for details).  38 year old appears otherwise well presents for evaluation of work note.  Has had congestion, rhinorrhea and nonproductive cough x3 days.  Patient states symptoms are resolved with OTC Mucinex however work is requiring a note.  His heart and lungs are cleared.  No neck stiffness or neck rigidity.  No meningismus.  Abdomen soft, nontender without rebound or guarding.  No lower extremity edema, erythema, ecchymosis or warmth.  Unknown COVID exposures. Vitals are stable, however hypertensive.  Patient states he has a history of "High Dr. blood pressure when I see you guys."  He has not a formal diagnosis of hypertension.  He denies headache, nausea, vomiting, chest pain, shortness of breath, abdominal pain.  Low suspicion for hypertensive urgency or emergency.  I discussed with patient he needs outpatient follow-up for this.  Discussed with patient that untreated hypertension can lead to stroke, hypertensive heart disease, kidney failure and death.  Patient voiced understanding and will follow-up outpatient.  No signs of dehydration, tolerating PO's.  Lungs are clear.  Discussed with patient possible chest x-ray for cough however patient  declined.  Discussed risk versus benefit to include missed diagnosis of bacterial pneumonia, pneumothorax, pulmonary edema, mass however patient declines.  Patient states "I am just here for my COVID test and work note. Patient will be discharged with instructions to orally hydrate, rest, and use over-the-counter medications such as anti-inflammatories ibuprofen and Aleve for muscle aches and Tylenol for fever.  No evidence of sepsis or sirs.  The patient has been appropriately medically screened and/or stabilized in the ED. I have low suspicion for any other emergent medical condition which would require further screening, evaluation or treatment in the ED or require inpatient management. Patient is hemodynamically stable and in no acute distress.  Patient able to ambulate in department prior to ED.  Evaluation does not show acute pathology that would require ongoing or additional emergent interventions while in the emergency department or further inpatient treatment.  I have discussed the diagnosis with the patient and answered all questions.  Patient has no further complaints prior to discharge.  Patient is comfortable with plan discussed in room and is stable for discharge at this time.  I have discussed strict return precautions for returning to the emergency department.  Patient was encouraged to follow-up with PCP/specialist refer to at discharge.    Myreon Wimer was evaluated in Emergency Department on 02/04/2019 for the symptoms described in the history of present illness. He was evaluated in the context of the global COVID-19 pandemic, which necessitated consideration that the patient might be at risk for infection with the SARS-CoV-2 virus that causes COVID-19. Institutional protocols and algorithms that pertain to the evaluation of patients at risk for COVID-19 are in a state of rapid change based on information released by regulatory bodies including the CDC and federal and state organizations. These  policies and algorithms were followed during the patient's care in the ED.   Final Clinical Impressions(s) / ED Diagnoses   Final diagnoses:  Exposure to Covid-19 Virus  Nose congestion  Cough  Elevated blood pressure reading    ED Discharge Orders    None       Beula Joyner A, PA-C 16/10/96 0454    Delora Fuel, MD 09/81/19 [phone]

## 2019-02-03 NOTE — Discharge Instructions (Addendum)
You will be notified if the test is positive. I have written you a work note. Follow up with PCP for your blood pressure.

## 2019-02-03 NOTE — ED Triage Notes (Signed)
Pt reports cold symptoms over the last 3 days. He states that he tried to go to work today and they sent him here to be tested for COVID before they will let him back.

## 2019-02-05 LAB — NOVEL CORONAVIRUS, NAA (HOSP ORDER, SEND-OUT TO REF LAB; TAT 18-24 HRS): SARS-CoV-2, NAA: NOT DETECTED

## 2020-07-05 ENCOUNTER — Emergency Department (HOSPITAL_COMMUNITY): Payer: BC Managed Care – PPO | Admitting: Certified Registered Nurse Anesthetist

## 2020-07-05 ENCOUNTER — Emergency Department (HOSPITAL_COMMUNITY): Payer: BC Managed Care – PPO

## 2020-07-05 ENCOUNTER — Encounter (HOSPITAL_COMMUNITY)
Admission: EM | Disposition: A | Discharge status: Home / Self Care | Payer: Self-pay | Source: Home / Self Care | Attending: Emergency Medicine

## 2020-07-05 ENCOUNTER — Encounter (HOSPITAL_COMMUNITY): Payer: Self-pay

## 2020-07-05 ENCOUNTER — Ambulatory Visit (HOSPITAL_COMMUNITY)
Admission: EM | Admit: 2020-07-05 | Discharge: 2020-07-05 | Disposition: A | Discharge status: Home / Self Care | Payer: BC Managed Care – PPO | Attending: Emergency Medicine | Admitting: Emergency Medicine

## 2020-07-05 DIAGNOSIS — Z20822 Contact with and (suspected) exposure to covid-19: Secondary | ICD-10-CM | POA: Insufficient documentation

## 2020-07-05 DIAGNOSIS — W1839XA Other fall on same level, initial encounter: Secondary | ICD-10-CM | POA: Insufficient documentation

## 2020-07-05 DIAGNOSIS — W230XXA Caught, crushed, jammed, or pinched between moving objects, initial encounter: Secondary | ICD-10-CM | POA: Insufficient documentation

## 2020-07-05 DIAGNOSIS — S83005A Unspecified dislocation of left patella, initial encounter: Secondary | ICD-10-CM

## 2020-07-05 DIAGNOSIS — Y9389 Activity, other specified: Secondary | ICD-10-CM | POA: Diagnosis not present

## 2020-07-05 DIAGNOSIS — Y990 Civilian activity done for income or pay: Secondary | ICD-10-CM | POA: Diagnosis not present

## 2020-07-05 DIAGNOSIS — S83015A Lateral dislocation of left patella, initial encounter: Secondary | ICD-10-CM | POA: Insufficient documentation

## 2020-07-05 DIAGNOSIS — S8782XA Crushing injury of left lower leg, initial encounter: Secondary | ICD-10-CM | POA: Diagnosis present

## 2020-07-05 HISTORY — PX: CLOSED REDUCTION PATELLAR: SHX5007

## 2020-07-05 HISTORY — PX: ORIF PATELLA: SHX5033

## 2020-07-05 LAB — CBC
HCT: 51 % (ref 39.0–52.0)
Hemoglobin: 16.8 g/dL (ref 13.0–17.0)
MCH: 31.2 pg (ref 26.0–34.0)
MCHC: 32.9 g/dL (ref 30.0–36.0)
MCV: 94.6 fL (ref 80.0–100.0)
Platelets: 218 10*3/uL (ref 150–400)
RBC: 5.39 MIL/uL (ref 4.22–5.81)
RDW: 14.6 % (ref 11.5–15.5)
WBC: 7.7 10*3/uL (ref 4.0–10.5)
nRBC: 0 % (ref 0.0–0.2)

## 2020-07-05 LAB — I-STAT CHEM 8, ED
BUN: 11 mg/dL (ref 6–20)
Calcium, Ion: 1.12 mmol/L — ABNORMAL LOW (ref 1.15–1.40)
Chloride: 108 mmol/L (ref 98–111)
Creatinine, Ser: 1.1 mg/dL (ref 0.61–1.24)
Glucose, Bld: 119 mg/dL — ABNORMAL HIGH (ref 70–99)
HCT: 50 % (ref 39.0–52.0)
Hemoglobin: 17 g/dL (ref 13.0–17.0)
Potassium: 5.2 mmol/L — ABNORMAL HIGH (ref 3.5–5.1)
Sodium: 140 mmol/L (ref 135–145)
TCO2: 31 mmol/L (ref 22–32)

## 2020-07-05 LAB — RESP PANEL BY RT-PCR (FLU A&B, COVID) ARPGX2
Influenza A by PCR: NEGATIVE
Influenza B by PCR: NEGATIVE
SARS Coronavirus 2 by RT PCR: NEGATIVE

## 2020-07-05 LAB — COMPREHENSIVE METABOLIC PANEL
ALT: 49 U/L — ABNORMAL HIGH (ref 0–44)
AST: 47 U/L — ABNORMAL HIGH (ref 15–41)
Albumin: 4 g/dL (ref 3.5–5.0)
Alkaline Phosphatase: 64 U/L (ref 38–126)
Anion gap: 9 (ref 5–15)
BUN: 10 mg/dL (ref 6–20)
CO2: 25 mmol/L (ref 22–32)
Calcium: 9.7 mg/dL (ref 8.9–10.3)
Chloride: 105 mmol/L (ref 98–111)
Creatinine, Ser: 1.11 mg/dL (ref 0.61–1.24)
GFR, Estimated: >60 mL/min (ref >60)
Glucose, Bld: 119 mg/dL — ABNORMAL HIGH (ref 70–99)
Potassium: 5.4 mmol/L — ABNORMAL HIGH (ref 3.5–5.1)
Sodium: 139 mmol/L (ref 135–145)
Total Bilirubin: 1.2 mg/dL (ref 0.3–1.2)
Total Protein: 7.7 g/dL (ref 6.5–8.1)

## 2020-07-05 LAB — SAMPLE TO BLOOD BANK

## 2020-07-05 LAB — LACTIC ACID, PLASMA: Lactic Acid, Venous: 1.9 mmol/L (ref 0.5–1.9)

## 2020-07-05 LAB — ETHANOL: Alcohol, Ethyl (B): <10 mg/dL (ref <10)

## 2020-07-05 LAB — PROTIME-INR
INR: 0.9 (ref 0.8–1.2)
Prothrombin Time: 12 seconds (ref 11.4–15.2)

## 2020-07-05 SURGERY — CLOSED REDUCTION, PATELLA
Anesthesia: General | Site: Knee | Laterality: Left

## 2020-07-05 MED ORDER — DEXTROSE 5 % IV SOLN
3.0000 g | INTRAVENOUS | Status: AC
  Administered 2020-07-05: 15:00:00 3 g via INTRAVENOUS
  Filled 2020-07-05: qty 3

## 2020-07-05 MED ORDER — LIDOCAINE 2% (20 MG/ML) 5 ML SYRINGE
INTRAMUSCULAR | Status: DC | PRN
  Administered 2020-07-05: 100 mg via INTRAVENOUS

## 2020-07-05 MED ORDER — OXYCODONE HCL 5 MG PO TABS: 5.0000 mg | ORAL_TABLET | ORAL | 0 refills | Status: DC | PRN

## 2020-07-05 MED ORDER — PROPOFOL 10 MG/ML IV BOLUS
INTRAVENOUS | Status: AC | PRN
Start: 2020-07-05 — End: 2020-07-05
  Administered 2020-07-05 (×2): 50 mg via INTRAVENOUS

## 2020-07-05 MED ORDER — FENTANYL CITRATE (PF) 100 MCG/2ML IJ SOLN
INTRAMUSCULAR | Status: AC
  Filled 2020-07-05: qty 2

## 2020-07-05 MED ORDER — ONDANSETRON 4 MG PO TBDP: 4.0000 mg | ORAL_TABLET | Freq: Three times a day (TID) | ORAL | 0 refills | Status: DC | PRN

## 2020-07-05 MED ORDER — FENTANYL CITRATE (PF) 100 MCG/2ML IJ SOLN: 50.0000 ug | Freq: Once | INTRAMUSCULAR | Status: AC

## 2020-07-05 MED ORDER — FENTANYL CITRATE (PF) 100 MCG/2ML IJ SOLN
100.0000 ug | Freq: Once | INTRAMUSCULAR | Status: AC
  Administered 2020-07-05: 100 ug via INTRAVENOUS

## 2020-07-05 MED ORDER — HYDROMORPHONE HCL 1 MG/ML IJ SOLN
INTRAMUSCULAR | Status: AC
  Filled 2020-07-05: qty 1

## 2020-07-05 MED ORDER — 0.9 % SODIUM CHLORIDE (POUR BTL) OPTIME
TOPICAL | Status: DC | PRN
  Administered 2020-07-05: 15:00:00 1000 mL

## 2020-07-05 MED ORDER — POVIDONE-IODINE 10 % EX SWAB: 2.0000 "application " | Freq: Once | CUTANEOUS | Status: DC

## 2020-07-05 MED ORDER — DEXAMETHASONE SODIUM PHOSPHATE 10 MG/ML IJ SOLN
INTRAMUSCULAR | Status: DC | PRN
  Administered 2020-07-05: 10 mg via INTRAVENOUS

## 2020-07-05 MED ORDER — PROPOFOL 10 MG/ML IV BOLUS
INTRAVENOUS | Status: AC | PRN
  Administered 2020-07-05: 50 mg via INTRAVENOUS

## 2020-07-05 MED ORDER — PROPOFOL 10 MG/ML IV BOLUS
INTRAVENOUS | Status: AC
  Administered 2020-07-05: 10:00:00 100 mg via INTRAVENOUS
  Filled 2020-07-05: qty 40

## 2020-07-05 MED ORDER — HYDROMORPHONE HCL 1 MG/ML IJ SOLN
1.0000 mg | Freq: Once | INTRAMUSCULAR | Status: AC
  Administered 2020-07-05: 04:00:00 1 mg via INTRAVENOUS
  Filled 2020-07-05: qty 1

## 2020-07-05 MED ORDER — METHOCARBAMOL 500 MG PO TABS: 500.0000 mg | ORAL_TABLET | Freq: Four times a day (QID) | ORAL | 1 refills | Status: DC | PRN

## 2020-07-05 MED ORDER — FENTANYL CITRATE (PF) 100 MCG/2ML IJ SOLN: 100.0000 ug | Freq: Once | INTRAMUSCULAR | Status: DC

## 2020-07-05 MED ORDER — CHLORHEXIDINE GLUCONATE 0.12 % MT SOLN
OROMUCOSAL | Status: AC
  Administered 2020-07-05: 14:00:00 15 mL via OROMUCOSAL
  Filled 2020-07-05: qty 15

## 2020-07-05 MED ORDER — LORAZEPAM 2 MG/ML IJ SOLN
1.0000 mg | Freq: Once | INTRAMUSCULAR | Status: AC
  Administered 2020-07-05: 06:00:00 1 mg via INTRAVENOUS
  Filled 2020-07-05: qty 1

## 2020-07-05 MED ORDER — PHENYLEPHRINE 40 MCG/ML (10ML) SYRINGE FOR IV PUSH (FOR BLOOD PRESSURE SUPPORT)
PREFILLED_SYRINGE | INTRAVENOUS | Status: DC | PRN
  Administered 2020-07-05 (×2): 80 ug via INTRAVENOUS
  Administered 2020-07-05: 120 ug via INTRAVENOUS

## 2020-07-05 MED ORDER — PROPOFOL 10 MG/ML IV BOLUS
INTRAVENOUS | Status: DC | PRN
  Administered 2020-07-05: 150 mg via INTRAVENOUS

## 2020-07-05 MED ORDER — ONDANSETRON HCL 4 MG/2ML IJ SOLN
INTRAMUSCULAR | Status: DC | PRN
  Administered 2020-07-05: 4 mg via INTRAVENOUS

## 2020-07-05 MED ORDER — MEPERIDINE HCL 25 MG/ML IJ SOLN: 6.2500 mg | INTRAMUSCULAR | Status: DC | PRN

## 2020-07-05 MED ORDER — FENTANYL CITRATE (PF) 250 MCG/5ML IJ SOLN
INTRAMUSCULAR | Status: DC | PRN
  Administered 2020-07-05 (×3): 50 ug via INTRAVENOUS

## 2020-07-05 MED ORDER — CHLORHEXIDINE GLUCONATE 4 % EX LIQD
60.0000 mL | Freq: Once | CUTANEOUS | Status: DC
  Filled 2020-07-05: qty 60

## 2020-07-05 MED ORDER — PROPOFOL 10 MG/ML IV BOLUS
INTRAVENOUS | Status: AC
  Filled 2020-07-05: qty 20

## 2020-07-05 MED ORDER — OXYCODONE HCL 5 MG/5ML PO SOLN: 5.0000 mg | Freq: Once | ORAL | Status: DC | PRN

## 2020-07-05 MED ORDER — MIDAZOLAM HCL 2 MG/2ML IJ SOLN
INTRAMUSCULAR | Status: DC | PRN
  Administered 2020-07-05: 2 mg via INTRAVENOUS

## 2020-07-05 MED ORDER — PROPOFOL 10 MG/ML IV BOLUS: 100.0000 mg | Freq: Once | INTRAVENOUS | Status: AC

## 2020-07-05 MED ORDER — FENTANYL CITRATE (PF) 100 MCG/2ML IJ SOLN: 100.0000 ug | INTRAMUSCULAR | Status: DC | PRN

## 2020-07-05 MED ORDER — HYDROMORPHONE HCL 1 MG/ML IJ SOLN
1.0000 mg | INTRAMUSCULAR | Status: DC | PRN
Start: 2020-07-05 — End: 2020-07-05
  Administered 2020-07-05: 1 mg via INTRAVENOUS
  Filled 2020-07-05: qty 1

## 2020-07-05 MED ORDER — SODIUM CHLORIDE 0.9 % IV BOLUS (SEPSIS)
1000.0000 mL | Freq: Once | INTRAVENOUS | Status: AC
  Administered 2020-07-05: 04:00:00 1000 mL via INTRAVENOUS

## 2020-07-05 MED ORDER — FENTANYL CITRATE (PF) 250 MCG/5ML IJ SOLN
INTRAMUSCULAR | Status: AC
  Filled 2020-07-05: qty 5

## 2020-07-05 MED ORDER — LACTATED RINGERS IV SOLN: INTRAVENOUS | Status: DC

## 2020-07-05 MED ORDER — OXYCODONE HCL 5 MG PO TABS: 5.0000 mg | ORAL_TABLET | Freq: Once | ORAL | Status: DC | PRN

## 2020-07-05 MED ORDER — PROPOFOL 10 MG/ML IV BOLUS
50.0000 mg | Freq: Once | INTRAVENOUS | Status: AC
  Administered 2020-07-05: 05:00:00 50 mg via INTRAVENOUS
  Filled 2020-07-05: qty 20

## 2020-07-05 MED ORDER — PROMETHAZINE HCL 25 MG/ML IJ SOLN: 6.2500 mg | INTRAMUSCULAR | Status: DC | PRN

## 2020-07-05 MED ORDER — FENTANYL CITRATE (PF) 100 MCG/2ML IJ SOLN
INTRAMUSCULAR | Status: AC
  Administered 2020-07-05: 10:00:00 50 ug via INTRAVENOUS
  Filled 2020-07-05: qty 2

## 2020-07-05 MED ORDER — HYDROMORPHONE HCL 1 MG/ML IJ SOLN
0.2500 mg | INTRAMUSCULAR | Status: DC | PRN
  Administered 2020-07-05 (×2): 0.5 mg via INTRAVENOUS

## 2020-07-05 MED ORDER — MIDAZOLAM HCL 2 MG/2ML IJ SOLN
INTRAMUSCULAR | Status: AC
  Filled 2020-07-05: qty 2

## 2020-07-05 MED ORDER — SUCCINYLCHOLINE CHLORIDE 20 MG/ML IJ SOLN
INTRAMUSCULAR | Status: DC | PRN
  Administered 2020-07-05: 140 mg via INTRAVENOUS

## 2020-07-05 MED ORDER — CHLORHEXIDINE GLUCONATE 0.12 % MT SOLN
15.0000 mL | OROMUCOSAL | Status: AC
  Filled 2020-07-05: qty 15

## 2020-07-05 MED ORDER — FENTANYL CITRATE (PF) 100 MCG/2ML IJ SOLN
INTRAMUSCULAR | Status: AC
  Administered 2020-07-05: 08:00:00 100 ug via INTRAVENOUS
  Filled 2020-07-05: qty 2

## 2020-07-05 SURGICAL SUPPLY — 37 items
APL PRP STRL LF DISP 70% ISPRP (MISCELLANEOUS) ×1
BNDG ELASTIC 6X5.8 VLCR STR LF (GAUZE/BANDAGES/DRESSINGS) ×2 IMPLANT
CHLORAPREP W/TINT 26 (MISCELLANEOUS) ×2 IMPLANT
COVER SURGICAL LIGHT HANDLE (MISCELLANEOUS) ×2 IMPLANT
CUFF TOURN SGL QUICK 34 (TOURNIQUET CUFF) ×3
CUFF TRNQT CYL 34X4.125X (TOURNIQUET CUFF) IMPLANT
DRSG PAD ABDOMINAL 8X10 ST (GAUZE/BANDAGES/DRESSINGS) ×2 IMPLANT
ELECT REM PT RETURN 9FT ADLT (ELECTROSURGICAL) ×3
ELECTRODE REM PT RTRN 9FT ADLT (ELECTROSURGICAL) IMPLANT
GAUZE SPONGE 4X4 12PLY STRL (GAUZE/BANDAGES/DRESSINGS) ×2 IMPLANT
GAUZE XEROFORM 5X9 LF (GAUZE/BANDAGES/DRESSINGS) ×2 IMPLANT
GLOVE BIO SURGEON STRL SZ7.5 (GLOVE) ×4 IMPLANT
GLOVE BIOGEL PI IND STRL 8 (GLOVE) IMPLANT
GLOVE BIOGEL PI INDICATOR 8 (GLOVE) ×4
GOWN STRL REUS W/ TWL LRG LVL3 (GOWN DISPOSABLE) IMPLANT
GOWN STRL REUS W/TWL LRG LVL3 (GOWN DISPOSABLE) ×3
IMMOBILIZER KNEE 22 UNIV (SOFTGOODS) ×2 IMPLANT
KIT BASIN OR (CUSTOM PROCEDURE TRAY) ×2 IMPLANT
KIT TURNOVER KIT B (KITS) ×2 IMPLANT
NS IRRIG 1000ML POUR BTL (IV SOLUTION) ×2 IMPLANT
PACK ORTHO EXTREMITY (CUSTOM PROCEDURE TRAY) ×2 IMPLANT
PAD ARMBOARD 7.5X6 YLW CONV (MISCELLANEOUS) ×2 IMPLANT
STAPLER VISISTAT 35W (STAPLE) ×2 IMPLANT
STOCKINETTE IMPERVIOUS LG (DRAPES) ×2 IMPLANT
SUT FIBERWIRE #2 38 T-5 BLUE (SUTURE) ×3
SUT FIBERWIRE 2-0 18 17.9 3/8 (SUTURE) ×3
SUT VIC AB 1 CT1 27 (SUTURE) ×3
SUT VIC AB 1 CT1 27XBRD ANBCTR (SUTURE) IMPLANT
SUT VIC AB 2-0 CT1 27 (SUTURE) ×6
SUT VIC AB 2-0 CT1 TAPERPNT 27 (SUTURE) IMPLANT
SUTURE FIBERWR #2 38 T-5 BLUE (SUTURE) IMPLANT
SUTURE FIBERWR 2-0 18 17.9 3/8 (SUTURE) IMPLANT
TOWEL GREEN STERILE (TOWEL DISPOSABLE) ×2 IMPLANT
TOWEL GREEN STERILE FF (TOWEL DISPOSABLE) ×2 IMPLANT
TUBE CONNECTING 12'X1/4 (SUCTIONS) ×1
TUBE CONNECTING 12X1/4 (SUCTIONS) ×1 IMPLANT
YANKAUER SUCT BULB TIP NO VENT (SUCTIONS) ×2 IMPLANT

## 2020-07-05 NOTE — Sedation Documentation (Signed)
MD attempted reduction of L knee, unsuccessful, ortho paged
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
## 2020-07-05 NOTE — Anesthesia Postprocedure Evaluation (Signed)
Anesthesia Post Note  Patient: Bobby Jones  Procedure(s) Performed: ATTEMPTED CLOSED REDUCTION PATELLA (Left Knee) OPEN REDUCTION OF LATERAL PATELLA DISLOCATION (Left Knee)     Patient location during evaluation: PACU Anesthesia Type: General Level of consciousness: awake and alert Pain management: pain level controlled Vital Signs Assessment: post-procedure vital signs reviewed and stable Respiratory status: spontaneous breathing, nonlabored ventilation, respiratory function stable and patient connected to nasal cannula oxygen Cardiovascular status: blood pressure returned to baseline and stable Postop Assessment: no apparent nausea or vomiting Anesthetic complications: no   No complications documented.  Last Vitals:  Vitals:   07/05/20 1345 07/05/20 1351  BP: (!) 154/106   Pulse: (!) 113   Resp: 19   Temp:  36.7 C  SpO2: 99%     Last Pain:  Vitals:   07/05/20 1351  TempSrc: Oral  PainSc:                  Trevor Iha

## 2020-07-05 NOTE — OR Nursing (Signed)
Pt is awake,alert and oriented. Pt is in NAD at this time. Pt and family verbalized understanding of poc and discharge instructions. instructions given to family and reviewed prior to discharge.   
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
## 2020-07-05 NOTE — Procedures (Signed)
Procedure: Left patella closed reduction  Indication: Left patella dislocation  Surgeon: Charma Igo, PA-C  Assist: None  Anesthesia: Propofol via EDP  EBL: None  Complications: Unsuccessful  Findings: After risks/benefits explained patient desires to undergo procedure. Consent obtained and time out performed. Sedation given and verified. Multiple attempts made to reduce patella but were unsuccessful. Pt tolerated the procedure well.    Freeman Caldron, PA-C Orthopedic Surgery [phone]

## 2020-07-05 NOTE — Progress Notes (Signed)
Orthopedic Tech Progress Note Patient Details:  Bobby Jones 08-25-1980 [id number] Came to apply items to patient when MD stated nevermind patient is going to OR.  Patient ID: [name], male   DOB: 1981-05-31, 39 y.o.   MRN: [id number]   Donald Pore 07/05/2020, 10:24 AM
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
## 2020-07-05 NOTE — Brief Op Note (Signed)
07/05/2020  3:34 PM  PATIENT:  Bobby Jones  39 y.o. male  PRE-OPERATIVE DIAGNOSIS:  left patella dislocation  POST-OPERATIVE DIAGNOSIS:  left patella dislocation  PROCEDURE:  Procedure(s): 1. ATTEMPTED CLOSED REDUCTION PATELLA (Left) 2. OPEN REDUCTION of lateral patella dislocation 3.  Imbrication of medial retinaculum left patellofemoral joint  SURGEON:  Surgeon(s) and Role:    * Yolonda Kida, MD - Primary  PHYSICIAN ASSISTANT: Dion Saucier, PA-C  ASSISTANT Attestation: PA Mcclung was utilized throughout the procedure for positioning the patient, approach to the patellofemoral joint, reduction of the patella as well as imbrication of the medial retinaculum and closure of wounds.  ANESTHESIA:   general  EBL:   10 cc  BLOOD ADMINISTERED:none  DRAINS: none   LOCAL MEDICATIONS USED:  NONE  SPECIMEN:  No Specimen  DISPOSITION OF SPECIMEN:  N/A  COUNTS:  YES  TOURNIQUET:  * Missing tourniquet times found for documented tourniquets in log: [id number] *  DICTATION: .Note written in EPIC  PLAN OF CARE: Discharge to home after PACU  PATIENT DISPOSITION:  PACU - hemodynamically stable.   Delay start of Pharmacological VTE agent (>24hrs) due to surgical blood loss or risk of bleeding: not applicable

## 2020-07-05 NOTE — Transfer of Care (Signed)
Immediate Anesthesia Transfer of Care Note  Patient: Bobby Jones  Procedure(s) Performed: ATTEMPTED CLOSED REDUCTION PATELLA (Left Knee) OPEN REDUCTION OF LATERAL PATELLA DISLOCATION (Left Knee)  Patient Location: PACU  Anesthesia Type:General  Level of Consciousness: drowsy and patient cooperative  Airway & Oxygen Therapy: Patient Spontanous Breathing  Post-op Assessment: Report given to RN, Post -op Vital signs reviewed and stable and Patient moving all extremities X 4  Post vital signs: Reviewed and stable  Last Vitals:  Vitals Value Taken Time  BP 137/100 07/05/20 1604  Temp    Pulse 105 07/05/20 1605  Resp 18 07/05/20 1605  SpO2 99 % 07/05/20 1605  Vitals shown include unvalidated device data.  Last Pain:  Vitals:   07/05/20 1351  TempSrc: Oral  PainSc:          Complications: No complications documented.

## 2020-07-05 NOTE — Op Note (Signed)
Date of Surgery: 07/05/2020  INDICATIONS: Bobby Jones is a 39 y.o.-year-old male with a left patella dislocation in the lateral direction.  This is a result of a work-related injury early this morning.  He had a failed attempt at closed reduction by the emergency department provider.  We recommended advanced imaging to further assess the nature of this given the unique inability for this to be reduced.  A subsequent attempt was performed using conscious sedation.  This was also unsuccessful.  He was then indicated for a complete muscle relaxation attempt in the operative theater.  We also discussed the possibility of open reduction given that the MRI indicated a engaged lateral dislocation on the medial aspect of the patella.;  The patient did consent to the procedure after discussion of the risks and benefits.  PREOPERATIVE DIAGNOSIS:  Left knee traumatic lateral patellofemoral dislocation  POSTOPERATIVE DIAGNOSIS: Same.  PROCEDURE:  1.  Attempted close reduction of left patellofemoral joint, unsuccessful. 2.  Open reduction left patellofemoral joint 3.  Open medial retinacular imbrication left patellofemoral joint  SURGEON: Bobby Jones, M.D.  ASSIST: Bobby Saucier, Bobby-C  ASSISTANT Attestation: Bobby Jones was utilized throughout the procedure for positioning the patient, approach to the patellofemoral joint, reduction of the patella as well as imbrication of the medial retinaculum and closure of wounds.  ANESTHESIA:  general  IV FLUIDS AND URINE: See anesthesia.  ESTIMATED BLOOD LOSS: 10 mL.  IMPLANTS: none  DRAINS: none  COMPLICATIONS: None.  DESCRIPTION OF PROCEDURE: The patient was brought to the operating room and placed supine on the operating table.  The patient had been signed prior to the procedure and this was documented. The patient had the anesthesia placed by the anesthesiologist.  A time-out was performed to confirm that this was the correct patient, site, side and  location.  Following administration of muscle paralytic we began with the attempted closed reduction of this lateral dislocated patella.  We were unfortunately not successful after about 5 minutes of manipulation of the patella as well as flexion extension of the knee.  This was a very well fixed lateral dislocation.  We then elected to proceed with the open reduction of the patellofemoral joint.  A second preprocedural timeout was performed before the second procedure.  Preoperative antibiotics were given.  The operative extremity was prepped and draped in standard sterile fashion.  A nonsterile tourniquet was placed prior to this.  This was inflated at 300 mmHg and left in slated for approximately 40 minutes.    We performed a midline incision and then worked through a medial parapatellar arthrotomy.  There was obvious lateral subluxation of the patella against the trochlea.  This was well fixed and engaged in a impaction style injury to the medial patellar facet.  Of note the articular cartilage of the trochlea as well as the undersurface of the patella were in good repair.  We extended the arthrotomy such that we could access the entire patella.  We then utilized a blunt retractor to pull more laterally against the medial facet of the patella.  This did disengage from the lateral edge of the trochlea.  This allowed for a smooth reduction of the patella onto the trochlea.  While ranging the knee from extension to flexion we did note lateral subluxation persisted.  At this juncture we then elected to perform a medial imbrication.  For the medial imbrication we used a pants over vest technique.  We utilized a #2 FiberWire to perform the indication.  2 separate embrocation stitches were placed along the superior pole of the patella.  This then allowed for smooth range of motion without any lateral translation.  The knee was then copiously irrigated with normal saline.  Superior and inferior remaining  aspects of the medial parapatellar arthrotomy was closed with interrupted figure-of-eight #1 Vicryl.  We irrigated the wound once again.  Subcutaneous fat was closed with running, locking #1 Vicryl.  We then closed subcutaneous skin with 2-0 Vicryl and skin with staples.   The leg was then cleaned and dried and a standard sterile dressing was applied.  We also placed the leg in a knee immobilizer.  Patient was then awakened from general anesthetic.  There were no noted intraoperative complications.  All counts were correct x2.  He was transported to PACU in stable condition.  POSTOPERATIVE PLAN:  Bobby Jones will be weightbearing as tolerated in his knee immobilizer.  He may remove the knee immobilizer while sleeping or resting.  Otherwise this in the immobilizer will be in place.  He will be reassessed in the office in 2 weeks where we will assess incision and likely begin physical therapy at that time.  He will be out of work at least for the next 6 weeks as he recovers from his injury.

## 2020-07-05 NOTE — ED Provider Notes (Signed)
0700 Care of the patient assumed at the change of shift pending MRI and Ortho consult  8:38 AM MRI results and images reviewed, discussed with Earney Hamburg, PA on call for Ortho who would like to attempt reduction in the ED with propofol sedation when a monitored bed is available.   9:26 AM Patient lost IV access during MRI. L EJ placed under US guidance without difficulty. RN will set up for sedation.   .Sedation  Date/Time: 07/05/2020 9:53 AM Performed by: Pollyann Savoy, MD Authorized by: Pollyann Savoy, MD   Consent:    Consent obtained:  Verbal and written   Consent given by:  Patient and parent   Risks discussed:  Dysrhythmia, inadequate sedation, nausea, prolonged hypoxia resulting in organ damage, prolonged sedation necessitating reversal, respiratory compromise necessitating ventilatory assistance and intubation and vomiting   Alternatives discussed:  Analgesia without sedation Universal protocol:    Procedure explained and questions answered to patient or proxy's satisfaction: yes     Relevant documents present and verified: yes     Test results available: yes     Imaging studies available: yes     Required blood products, implants, devices, and special equipment available: yes     Site/side marked: yes     Immediately prior to procedure, a time out was called: yes     Patient identity confirmed:  Verbally with patient and arm band Indications:    Procedure performed:  Dislocation reduction   Procedure necessitating sedation performed by:  Different physician Pre-sedation assessment:    Time since last food or drink:  8   ASA classification: class 1 - normal, healthy patient     Mouth opening:  3 or more finger widths   Thyromental distance:  4 finger widths   Mallampati score:  I - soft palate, uvula, fauces, pillars visible   Neck mobility: normal     Pre-sedation assessments completed and reviewed: airway patency, cardiovascular function, hydration  status, mental status, nausea/vomiting, pain level, respiratory function and temperature     Pre-sedation assessment completed:  07/05/2020 9:30 AM Immediate pre-procedure details:    Reassessment: Patient reassessed immediately prior to procedure     Reviewed: vital signs, relevant labs/tests and NPO status     Verified: bag valve mask available, emergency equipment available, intubation equipment available, IV patency confirmed, oxygen available and suction available   Procedure details (see MAR for exact dosages):    Preoxygenation:  Nasal cannula   Sedation:  Propofol   Intended level of sedation: deep   Analgesia:  Fentanyl   Intra-procedure monitoring:  Blood pressure monitoring, cardiac monitor, continuous pulse oximetry, frequent LOC assessments, frequent vital sign checks and continuous capnometry   Intra-procedure events: none     Total Provider sedation time (minutes):  15 Post-procedure details:    Post-sedation assessment completed:  07/05/2020 9:54 AM   Attendance: Constant attendance by certified staff until patient recovered     Recovery: Patient returned to pre-procedure baseline     Post-sedation assessments completed and reviewed: airway patency, cardiovascular function, hydration status, mental status, nausea/vomiting, pain level, respiratory function and temperature     Patient is stable for discharge or admission: no     Procedure completion:  Tolerated well, no immediate complications Comments:     Unsuccessful reduction by Ortho, will need OR. Patient and mother aware of plan.      Pollyann Savoy, MD 07/05/20 1339

## 2020-07-05 NOTE — Anesthesia Procedure Notes (Signed)
Procedure Name: Intubation Date/Time: 07/05/2020 2:46 PM Performed by: Darletta Moll, CRNA Pre-anesthesia Checklist: Patient identified, Emergency Drugs available, Suction available and Patient being monitored Patient Re-evaluated:Patient Re-evaluated prior to induction Oxygen Delivery Method: Circle system utilized Preoxygenation: Pre-oxygenation with 100% oxygen Induction Type: IV induction Ventilation: Mask ventilation without difficulty Laryngoscope Size: Mac and 4 Grade View: Grade II Tube type: Oral Tube size: 7.5 mm Number of attempts: 1 Airway Equipment and Method: Stylet Placement Confirmation: ETT inserted through vocal cords under direct vision,  positive ETCO2 and breath sounds checked- equal and bilateral Secured at: 22 cm Tube secured with: Tape Dental Injury: Teeth and Oropharynx as per pre-operative assessment

## 2020-07-05 NOTE — Anesthesia Preprocedure Evaluation (Signed)
Anesthesia Evaluation  Patient identified by MRN, date of birth, ID band Patient awake    Reviewed: Allergy & Precautions, NPO status , Patient's Chart, lab work & pertinent test results  Airway Mallampati: I  TM Distance: >3 FB Neck ROM: Full    Dental   Pulmonary    Pulmonary exam normal        Cardiovascular Normal cardiovascular exam     Neuro/Psych    GI/Hepatic   Endo/Other    Renal/GU      Musculoskeletal   Abdominal   Peds  Hematology   Anesthesia Other Findings   Reproductive/Obstetrics                             Anesthesia Physical Anesthesia Plan  ASA: II  Anesthesia Plan: General   Post-op Pain Management:    Induction: Intravenous  PONV Risk Score and Plan: 2  Airway Management Planned: Oral ETT  Additional Equipment:   Intra-op Plan:   Post-operative Plan: Extubation in OR  Informed Consent: I have reviewed the patients History and Physical, chart, labs and discussed the procedure including the risks, benefits and alternatives for the proposed anesthesia with the patient or authorized representative who has indicated his/her understanding and acceptance.       Plan Discussed with: CRNA and Surgeon  Anesthesia Plan Comments:         Anesthesia Quick Evaluation  

## 2020-07-05 NOTE — OR Nursing (Signed)
Pt to car in wheelchair, pt received crutches states has used crutches several times before and aware of crutch training.  Pt in NAD and alert upon discharge

## 2020-07-05 NOTE — Progress Notes (Signed)
Discussed with Dr. Bebe Shaggy.  After failed attempts at closed reduction, would recommend MRI of knee to help explain irreducible patella before moving to another attempt or moving to OR.  Will follow up on MRI with formal recommendation of treatment.

## 2020-07-06 ENCOUNTER — Encounter (HOSPITAL_COMMUNITY): Payer: Self-pay

## 2020-07-06 ENCOUNTER — Encounter (HOSPITAL_COMMUNITY): Payer: Self-pay | Admitting: Orthopedic Surgery

## 2020-11-27 DIAGNOSIS — F333 Major depressive disorder, recurrent, severe with psychotic symptoms: Secondary | ICD-10-CM

## 2020-11-27 DIAGNOSIS — Z79899 Other long term (current) drug therapy: Secondary | ICD-10-CM | POA: Insufficient documentation

## 2020-11-27 DIAGNOSIS — Z20822 Contact with and (suspected) exposure to covid-19: Secondary | ICD-10-CM | POA: Insufficient documentation

## 2020-11-27 MED ORDER — MAGNESIUM HYDROXIDE 400 MG/5ML PO SUSP: 30.0000 mL | Freq: Every day | ORAL | Status: DC | PRN

## 2020-11-27 MED ORDER — HYDROXYZINE HCL 25 MG PO TABS
25.0000 mg | ORAL_TABLET | Freq: Three times a day (TID) | ORAL | Status: DC | PRN
  Administered 2020-11-28: 25 mg via ORAL
  Filled 2020-11-27: qty 1

## 2020-11-27 MED ORDER — ALUM & MAG HYDROXIDE-SIMETH 200-200-20 MG/5ML PO SUSP: 30.0000 mL | ORAL | Status: DC | PRN

## 2020-11-27 MED ORDER — ACETAMINOPHEN 325 MG PO TABS
650.0000 mg | ORAL_TABLET | Freq: Four times a day (QID) | ORAL | Status: DC | PRN
  Administered 2020-11-28: 650 mg via ORAL
  Filled 2020-11-27: qty 2

## 2020-11-27 MED ORDER — TRAZODONE HCL 50 MG PO TABS
50.0000 mg | ORAL_TABLET | Freq: Every evening | ORAL | Status: DC | PRN
  Administered 2020-11-28: 50 mg via ORAL
  Filled 2020-11-27: qty 1

## 2020-11-28 ENCOUNTER — Ambulatory Visit (HOSPITAL_COMMUNITY)
Admission: EM | Admit: 2020-11-28 | Discharge: 2020-11-28 | Disposition: A | Discharge status: Home / Self Care | Payer: BC Managed Care – PPO | Attending: Psychiatry | Admitting: Psychiatry

## 2020-11-28 DIAGNOSIS — Z79899 Other long term (current) drug therapy: Secondary | ICD-10-CM | POA: Diagnosis not present

## 2020-11-28 DIAGNOSIS — Z20822 Contact with and (suspected) exposure to covid-19: Secondary | ICD-10-CM | POA: Diagnosis not present

## 2020-11-28 DIAGNOSIS — F333 Major depressive disorder, recurrent, severe with psychotic symptoms: Secondary | ICD-10-CM

## 2020-11-28 DIAGNOSIS — R45851 Suicidal ideations: Secondary | ICD-10-CM | POA: Diagnosis present

## 2020-11-28 LAB — CBC WITH DIFFERENTIAL/PLATELET
Abs Immature Granulocytes: 0.04 10*3/uL (ref 0.00–0.07)
Basophils Absolute: 0 10*3/uL (ref 0.0–0.1)
Basophils Relative: 0 %
Eosinophils Absolute: 0.1 10*3/uL (ref 0.0–0.5)
Eosinophils Relative: 2 %
HCT: 52.7 % — ABNORMAL HIGH (ref 39.0–52.0)
Hemoglobin: 17.6 g/dL — ABNORMAL HIGH (ref 13.0–17.0)
Immature Granulocytes: 1 %
Lymphocytes Relative: 33 %
Lymphs Abs: 2.7 10*3/uL (ref 0.7–4.0)
MCH: 30.8 pg (ref 26.0–34.0)
MCHC: 33.4 g/dL (ref 30.0–36.0)
MCV: 92.1 fL (ref 80.0–100.0)
Monocytes Absolute: 0.7 10*3/uL (ref 0.1–1.0)
Monocytes Relative: 9 %
Neutro Abs: 4.6 10*3/uL (ref 1.7–7.7)
Neutrophils Relative %: 55 %
Platelets: 248 10*3/uL (ref 150–400)
RBC: 5.72 MIL/uL (ref 4.22–5.81)
RDW: 15.2 % (ref 11.5–15.5)
WBC: 8.2 10*3/uL (ref 4.0–10.5)
nRBC: 0 % (ref 0.0–0.2)

## 2020-11-28 LAB — POCT URINE DRUG SCREEN - MANUAL ENTRY (I-SCREEN)
POC Amphetamine UR: NOT DETECTED
POC Buprenorphine (BUP): NOT DETECTED
POC Cocaine UR: NOT DETECTED
POC Marijuana UR: NOT DETECTED
POC Methadone UR: NOT DETECTED
POC Methamphetamine UR: NOT DETECTED
POC Morphine: NOT DETECTED
POC Oxazepam (BZO): NOT DETECTED
POC Oxycodone UR: NOT DETECTED
POC Secobarbital (BAR): NOT DETECTED

## 2020-11-28 LAB — HEMOGLOBIN A1C
Hgb A1c MFr Bld: 6 % — ABNORMAL HIGH (ref 4.8–5.6)
Mean Plasma Glucose: 125.5 mg/dL

## 2020-11-28 LAB — COMPREHENSIVE METABOLIC PANEL
ALT: 47 U/L — ABNORMAL HIGH (ref 0–44)
AST: 32 U/L (ref 15–41)
Albumin: 4.1 g/dL (ref 3.5–5.0)
Alkaline Phosphatase: 81 U/L (ref 38–126)
Anion gap: 10 (ref 5–15)
BUN: 9 mg/dL (ref 6–20)
CO2: 23 mmol/L (ref 22–32)
Calcium: 9.4 mg/dL (ref 8.9–10.3)
Chloride: 106 mmol/L (ref 98–111)
Creatinine, Ser: 0.96 mg/dL (ref 0.61–1.24)
GFR, Estimated: >60 mL/min (ref >60)
Glucose, Bld: 104 mg/dL — ABNORMAL HIGH (ref 70–99)
Potassium: 4 mmol/L (ref 3.5–5.1)
Sodium: 139 mmol/L (ref 135–145)
Total Bilirubin: 1.1 mg/dL (ref 0.3–1.2)
Total Protein: 7.4 g/dL (ref 6.5–8.1)

## 2020-11-28 LAB — LIPID PANEL
Cholesterol: 188 mg/dL (ref 0–200)
HDL: 37 mg/dL — ABNORMAL LOW (ref >40)
LDL Cholesterol: 100 mg/dL — ABNORMAL HIGH (ref 0–99)
Total CHOL/HDL Ratio: 5.1 RATIO
Triglycerides: 257 mg/dL — ABNORMAL HIGH (ref <150)
VLDL: 51 mg/dL — ABNORMAL HIGH (ref 0–40)

## 2020-11-28 LAB — RESP PANEL BY RT-PCR (FLU A&B, COVID) ARPGX2
Influenza A by PCR: NEGATIVE
Influenza B by PCR: NEGATIVE
SARS Coronavirus 2 by RT PCR: NEGATIVE

## 2020-11-28 LAB — TSH: TSH: 2.332 u[IU]/mL (ref 0.350–4.500)

## 2020-11-28 LAB — POC SARS CORONAVIRUS 2 AG: SARSCOV2ONAVIRUS 2 AG: NEGATIVE

## 2020-11-28 LAB — ETHANOL: Alcohol, Ethyl (B): <10 mg/dL (ref <10)

## 2020-11-28 MED ORDER — CLONIDINE HCL 0.1 MG PO TABS
0.1000 mg | ORAL_TABLET | Freq: Once | ORAL | Status: AC
  Administered 2020-11-28: 0.1 mg via ORAL
  Filled 2020-11-28: qty 1

## 2020-11-28 MED ORDER — ESCITALOPRAM OXALATE 10 MG PO TABS: 10.0000 mg | ORAL_TABLET | Freq: Every day | ORAL | 0 refills | Status: DC

## 2020-11-28 MED ORDER — ESCITALOPRAM OXALATE 10 MG PO TABS
10.0000 mg | ORAL_TABLET | Freq: Every day | ORAL | Status: DC
  Administered 2020-11-28 (×2): 10 mg via ORAL
  Filled 2020-11-28: qty 1
  Filled 2020-11-28: qty 7
  Filled 2020-11-28: qty 1

## 2020-11-28 NOTE — ED Notes (Signed)
Ene Ajibola, NP notified of vital signs. 

## 2020-11-28 NOTE — Progress Notes (Signed)
Pt is alert and oriented. Pt complained of left knee pain. PRN Tylenol administered. No signs of acute distress noted. Pt recieved scheduled meds with no incident. Pt denies SI/HI/AVH at this time. Staff will monitor for pt's safety.

## 2020-11-28 NOTE — ED Notes (Signed)
Pt asleep in bed. Respirations even and unlabored. Will continue to monitor for safety. ?

## 2020-11-28 NOTE — Discharge Summary (Signed)
Bobby Jones to be D/C'd home per MD order. Discussed with the patient and all questions fully answered. An After Visit Summary was printed and given to the patient. Medication sample was also given to patient. Patient escorted out, and D/C home via private auto.  Bobby Jones  11/28/2020 10:31 AM

## 2020-11-28 NOTE — ED Notes (Addendum)
Pt admitted to continuous assessment due to SI. Pt A&O x4, very anxious but cooperative. Pt tolerated lab work and skin assessment well. Pt has soft brace to left knee that he states he "needs it to walk because of my surgery in December." NP notified of this brace and advises for staff to keep it at nurse's station and allow pt to wear while ambulating. Pt sticker placed on knee brace. Pt is aware to ask staff for brace to wear when he needs to ambulate. Pt ambulated independently to unit. Oriented to unit/staff. No signs of acute distress noted. Pt declined offer for food/drink. NP notified of pt's vital signs. Will continue to monitor for safety.

## 2020-11-28 NOTE — Discharge Instructions (Signed)

## 2020-11-28 NOTE — ED Provider Notes (Signed)
[facility]BC/OBS ASAP Discharge Summary  Date and Time: 11/28/2020 10:15 AM  Name: Bobby Jones  MRN:  [id number]   Discharge Diagnoses:  Final diagnoses:  MDD (major depressive disorder), recurrent, severe, with psychosis (HCC)    Subjective:  Patient seen and chart reviewed. He has been medication compliant. He recalls what led to presentation as per H&P, work accident in December, having to do PT every other day and having to return to work sooner than he anticipated. He stated that yesterday he was at a "breaking point" and that he thought about overdosing on some of his left over pain killers. Pt denies SA in the past. He denies SI/HI/AVH today and states that his mood is "ok". Pt denies HI/AVH. Pt states that he has only experienced AH one time which occurred yesterday prior to presentation. He states that the voice sounded like someone else's.   He states that he could keep himself safe if discharged today  and would like resources for outpatient services. He states that he would like "someone to talk to" given his recent stressors and states that he has been looking for this type of help for awhile but reports difficulty finding someone who would take his insurance. Pt states that he feels like he has a good support system and could reach out for help if needed. He engages in safety planning and states that he could reach out to his support system if he feels suicidal and would call 911, present to the ED or present to the [facility]. Marland Kitchen He denies SE of lexapro . Pt is future oriented and states that he lives with his mother and would need a work note and that he would like to return to work on Thursday as he has PT appointments "every other day" and his next appointment for PT is tomorrow.   Stay Summary:    Bobby Jones is 40 y/o male. Patient presented voluntarily to [facility] accompanied by several family members on 5/16. Patient presented with chief complaint of worsening depression and suicidal ideation with  plans to overdose. Patient is assessed with his mother Dayna Ramus Verley-Berns) present in assessment room. Patient gave verbal consent to his writer for his mother to be present and participating in assessment.   Patient report that he was involved in an injury at work and dislocated his left knee. He report that he had knee surgery and other procedures as well as physical therapy due to the injury. He report that he has been experiencing increased stress and anxiety due to returning to work too soon and worsening depression due to life alteration from dealing with his knee injury and ongoing left knee pain. He report that may have "PTSD" due to the work injury and that he has panic attacks when he goes to work. Patient states "I try to avoid looking or going to the area where the accident that work took place because of flashback." He states "I try to put on a brave face and not complain but this is weighing me down. "I've always had depressed mood and feel depressed but I try not to talk about it and try to be around family and laugh with them but I'm really depressed." He endorses depressive symptoms of "constant sadness, isolation, poor appetite, excessive sleep (16-17hrs/day), and hopelessness.  Patient endorses suicidal ideation with plans to overdose, he report that he is experiencing auditory hallucination of "voices saying just do it, do it so you can get rid of the pain." patient denies  HI, visual hallucination, paranoia, and no delusional thought content noted. Patient denies alcohol and illicit drug use.   Patient's mother report that patient was previously admitted to [facility] psychiatric hospital "for a few days for mental breakdown." She is unsure of patient's official dx during this admission to [facility]. she also report that patient's biological father and paternal uncle has hx of schizophrenia. She denies that patient has experienced any psychosis since this admission and report that patient is  not no any medication.   Patient was admitted for observation and started on lexapro. The following day patient denied SI/HI/AVH and could contract for safety. He did not meet criteria for inpatient admission. Pt tolerated lexapro well without SE and requested resources for outpatient psychiatrists and therapists.   Total Time spent with patient: 20 minutes  Past Psychiatric History: see H&P Past Medical History: No past medical history on file.  Past Surgical History:  Procedure Laterality Date  . CLOSED REDUCTION PATELLAR Left 07/05/2020   Procedure: ATTEMPTED CLOSED REDUCTION PATELLA;  Surgeon: Yolonda Kida, MD;  Location: [facility] OR;  Service: Orthopedics;  Laterality: Left;  . ORIF PATELLA Left 07/05/2020   Procedure: OPEN REDUCTION OF LATERAL PATELLA DISLOCATION;  Surgeon: Yolonda Kida, MD;  Location: [facility] OR;  Service: Orthopedics;  Laterality: Left;   Family History:  Family History  Problem Relation Age of Onset  . Diabetes Father   . Hypertension Other   . Diabetes Other   . Coronary artery disease Other    Family Psychiatric History: see H&P Social History:  Social History   Substance and Sexual Activity  Alcohol Use No     Social History   Substance and Sexual Activity  Drug Use No    Social History   Socioeconomic History  . Marital status: Single    Spouse name: Not on file  . Number of children: Not on file  . Years of education: Not on file  . Highest education level: Not on file  Occupational History  . Not on file  Tobacco Use  . Smoking status: Never Smoker  . Smokeless tobacco: Never Used  Vaping Use  . Vaping Use: Never used  Substance and Sexual Activity  . Alcohol use: No  . Drug use: No  . Sexual activity: Not on file  Other Topics Concern  . Not on file  Social History Narrative   ** Merged History Encounter **       Social Determinants of Health   Financial Resource Strain: Not on file  Food Insecurity: Not on file   Transportation Needs: Not on file  Physical Activity: Not on file  Stress: Not on file  Social Connections: Not on file   SDOH:  SDOH Screenings   Alcohol Screen: Not on file  Depression (PHQ2-9): Medium Risk  . PHQ-2 Score: 17  Financial Resource Strain: Not on file  Food Insecurity: Not on file  Housing: Not on file  Physical Activity: Not on file  Social Connections: Not on file  Stress: Not on file  Tobacco Use: Low Risk   . Smoking Tobacco Use: Never Smoker  . Smokeless Tobacco Use: Never Used  Transportation Needs: Not on file    Has this patient used any form of tobacco in the last 30 days? (Cigarettes, Smokeless Tobacco, Cigars, and/or Pipes) Prescription not provided because: n/a  Current Medications:  Current Facility-Administered Medications  Medication Dose Route Frequency Provider Last Rate Last Admin  . acetaminophen (TYLENOL) tablet 650 mg  650  mg Oral Q6H PRN Ajibola, Ene A, NP   650 mg at 11/28/20 0909  . alum & mag hydroxide-simeth (MAALOX/MYLANTA) 200-200-20 MG/5ML suspension 30 mL  30 mL Oral Q4H PRN Ajibola, Ene A, NP      . escitalopram (LEXAPRO) tablet 10 mg  10 mg Oral Daily Ajibola, Ene A, NP   10 mg at 11/28/20 0907  . hydrOXYzine (ATARAX/VISTARIL) tablet 25 mg  25 mg Oral TID PRN Ajibola, Ene A, NP   25 mg at 11/28/20 0036  . magnesium hydroxide (MILK OF MAGNESIA) suspension 30 mL  30 mL Oral Daily PRN Ajibola, Ene A, NP      . traZODone (DESYREL) tablet 50 mg  50 mg Oral QHS PRN Ajibola, Ene A, NP   50 mg at 11/28/20 0041   Current Outpatient Medications  Medication Sig Dispense Refill  . cyclobenzaprine (FLEXERIL) 10 MG tablet Take 1 tablet (10 mg total) by mouth 2 (two) times daily as needed for muscle spasms. 10 tablet 0  . escitalopram (LEXAPRO) 10 MG tablet Take 1 tablet (10 mg total) by mouth daily. 30 tablet 0  . ibuprofen (ADVIL,MOTRIN) 200 MG tablet Take 200 mg by mouth every 6 (six) hours as needed.    . methocarbamol (ROBAXIN) 500 MG  tablet Take 1 tablet (500 mg total) by mouth every 6 (six) hours as needed for muscle spasms. 50 tablet 1  . naproxen (NAPROSYN) 375 MG tablet Take 1 tablet (375 mg total) by mouth 2 (two) times daily. 20 tablet 0    PTA Medications: (Not in a hospital admission)   Musculoskeletal  Strength & Muscle Tone: within normal limits Gait & Station: normal Patient leans: N/A  Psychiatric Specialty Exam  Presentation  General Appearance: Appropriate for Environment; Casual  Eye Contact:Good  Speech:Clear and Coherent; Normal Rate  Speech Volume:Normal  Handedness:Right   Mood and Affect  Mood:Euthymic  Affect:Other (comment); Congruent (euthymic)   Thought Process  Thought Processes:Coherent; Goal Directed; Linear  Descriptions of Associations:Intact  Orientation:Full (Time, Place and Person)  Thought Content:WDL  Diagnosis of Schizophrenia or Schizoaffective disorder in past: No  Duration of Psychotic Symptoms: Less than six months   Hallucinations:Hallucinations: None Description of Auditory Hallucinations: voices saying "just do it, do it so you can get rid of the pain"  Ideas of Reference:None  Suicidal Thoughts:Suicidal Thoughts: No SI Active Intent and/or Plan: With Plan; With Intent  Homicidal Thoughts:Homicidal Thoughts: No   Sensorium  Memory:Immediate Good; Recent Good; Remote Good  Judgment:Good  Insight:Good   Executive Functions  Concentration:Good  Attention Span:Good  Recall:Good  Fund of Knowledge:Good  Language:Good   Psychomotor Activity  Psychomotor Activity:Psychomotor Activity: Normal   Assets  Assets:Communication Skills; Desire for Improvement; Social Support; Resilience; Housing; Vocational/Educational   Sleep  Sleep:Sleep: Fair Number of Hours of Sleep: 15   Nutritional Assessment (For OBS and FBC admissions only) Has the patient had a weight loss or gain of 10 pounds or more in the last 3 months?: No Has the  patient had a decrease in food intake/or appetite?: Yes Does the patient have dental problems?: No Does the patient have eating habits or behaviors that may be indicators of an eating disorder including binging or inducing vomiting?: No Has the patient recently lost weight without trying?: No Has the patient been eating poorly because of a decreased appetite?: No Malnutrition Screening Tool Score: 0    Physical Exam  Physical Exam Constitutional:      Appearance: Normal appearance. He is normal  weight.  HENT:     Head: Normocephalic and atraumatic.  Eyes:     Extraocular Movements: Extraocular movements intact.  Pulmonary:     Effort: Pulmonary effort is normal.  Neurological:     Mental Status: He is alert and oriented to person, place, and time.  Psychiatric:        Attention and Perception: Attention and perception normal.        Speech: Speech normal.        Behavior: Behavior normal. Behavior is cooperative.        Thought Content: Thought content normal.    Review of Systems  Constitutional: Negative for chills and fever.  HENT: Negative for hearing loss.   Eyes: Negative for discharge and redness.  Respiratory: Negative for cough.   Gastrointestinal: Negative for abdominal pain.  Musculoskeletal: Positive for joint pain.       Knee pain  Neurological: Negative for headaches.  Psychiatric/Behavioral: Positive for depression. Negative for suicidal ideas.   Blood pressure (!) 142/109, pulse 97, temperature 98.9 F (37.2 C), temperature source Oral, resp. rate 20, SpO2 99 %. There is no height or weight on file to calculate BMI.  Demographic Factors:  Male  Loss Factors: Decline in physical health  Historical Factors: NA  Risk Reduction Factors:   Sense of responsibility to family, Employed, Living with another person, especially a relative, Positive social support and Positive coping skills or problem solving skills  Continued Clinical Symptoms:  Previous  Psychiatric Diagnoses and Treatments Medical Diagnoses and Treatments/Surgeries  Cognitive Features That Contribute To Risk:  None    Suicide Risk:  Minimal: No identifiable suicidal ideation.  Patients presenting with no risk factors but with morbid ruminations; may be classified as minimal risk based on the severity of the depressive symptoms  Plan Of Care/Follow-up recommendations:  Activity:  as tolerated Diet:  regular Other:     Patient is instructed prior to discharge to: Take all medications as prescribed by his/her mental healthcare provider. Report any adverse effects and or reactions from the medicines to his/her outpatient provider promptly. Patient has been instructed & cautioned: To not engage in alcohol and or illegal drug use while on prescription medicines. In the event of worsening symptoms, patient is instructed to call the crisis hotline, 911 and or go to the nearest ED for appropriate evaluation and treatment of symptoms. To follow-up with his/her primary care provider for your other medical issues, concerns and or health care needs.   Patient denies SI/HI/AVH today and does not meet criteria for inpatient admission as he is not an imminent danger to self, others or acutely psychotic. He is able to contract for safety and is appropriate for discharge.   Patient was discharged with 7 day sample of lexapro 10 mg and a 30 day Rx was sent to pharmacy of choice. SW was consulted for resources for outpatient services and they were placed in AVS by SW.   Disposition: home  Estella HuskKatherine S Surina Storts, MD 11/28/2020, 10:15 AM
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
## 2020-11-28 NOTE — ED Provider Notes (Signed)
Behavioral Health Admission [facility]&P [facility](FBC & OBS)  Date: 12/01/20 Patient Name: Bobby RidgeCarlos Jones MRN: [id number] Chief Complaint:  Chief Complaint  Patient presents with  . Suicidal   Chief Complaint/Presenting Problem: Pt shares he has been expeirencing a great deal of pain, both physically and mentally, and that tonight he heard voices telling him to o/d on his medication.  Diagnoses:  Final diagnoses:  MDD (major depressive disorder), recurrent, severe, with psychosis (HCC)    HPI: Bobby RidgeCarlos [place] is 40 y/o male. [facility] accompanied by several family members. Patient presented with chief complaint of worsening depression and suicidal ideation with plans to overdose. Patient is assessed with his mother Dayna Ramus(Narble Faxon-Berns) present in assessment room. Patient gave verbal consent to his writer for his mother to be present and participating in assessment.   Patient is assessed by this NP. Patient is alert, oriented and able to fully participate in assessment. Patient denies acute illness, SOB, chest pain/discomfort, fever, chills, GI/GU symptoms. Patient is c/o left knees pain 5/10.   Patient report that he was involved in an injury at work and dislocated his left knee. He report that he had knee surgery and other procedures as well as physical therapy due to the injury. He report that he has been experiencing increased stress and anxiety due to returning to work too soon and worsening depression due to life alteration from dealing with his knee injury and ongoing left knee pain. He report that may have "PTSD" due to the work injury and that he has panic attacks when he goes to work. Patient states "I try to avoid looking or going to the area where the accident that work took place because of flashback." He states "I try to put on a brave face and not complain but this is weighing me down. "I've always had depressed mood and feel depressed but I try not to talk about it and try to  be around family and laugh with them but I'm really depressed." He endorses depressive symptoms of "constant sadness, isolation, poor appetite, excessive sleep (16-17hrs/day), and hopelessness.  Patient endorses suicidal ideation with plans to overdose, he report that he is experiencing auditory hallucination of "voices saying just do it, do it so you can get rid of the pain." patient denies HI, visual hallucination, paranoia, and no delusional thought content noted. Patient denies alcohol and illicit drug use.   Patient's mother report that patient was previously admitted [facility] "for a few days for mental breakdown." She is unsure of patient's official dx during this [facility]. she also report that patient's biological father and paternal uncle has hx of schizophrenia. She denies that patient has experienced any psychosis since this admission and report that patient is not no any medication.   PHQ 2-9:  Flowsheet Row ED [facility]  Thoughts that you would be better off dead, or of hurting yourself in some way Several days  PHQ-9 Total Score 17      Flowsheet Row ED [facility]  C-SSRS RISK CATEGORY High Risk       Total Time spent with patient: 20 minutes  Musculoskeletal  Strength & Muscle Tone: within normal limits Gait & Station: unsteady Patient leans: Right  Psychiatric Specialty Exam  Presentation General Appearance: Appropriate for Environment; Casual  Eye Contact:Good  Speech:Clear and Coherent; Normal Rate  Speech Volume:Normal  Handedness:Right   Mood and Affect  Mood:Euthymic  Affect:Other (comment); Congruent (euthymic)   Thought Process  Thought Processes:Coherent; Goal Directed; Linear  Descriptions of Associations:Intact  Orientation:Full (Time, Place and Person)  Thought Content:WDL    Hallucinations:No data  recorded  Ideas of Reference:None  Suicidal Thoughts:No data recorded  Homicidal Thoughts:No data recorded   Sensorium  Memory:Immediate Good; Recent Good; Remote Good  Judgment:Good  Insight:Good   Executive Functions  Concentration:Good  Attention Span:Good  Recall:Good  Fund of Knowledge:Good  Language:Good   Psychomotor Activity  Psychomotor Activity:No data recorded   Assets  Assets:Communication Skills; Desire for Improvement; Social Support; Resilience; Housing; Vocational/Educational   Sleep  Sleep:No data recorded   No data recorded   Physical Exam Constitutional:      General: He is not in acute distress.    Appearance: He is not toxic-appearing.  HENT:     Head: Normocephalic.     Nose: Nose normal.  Eyes:     General:        Right eye: No discharge.        Left eye: No discharge.  Cardiovascular:     Rate and Rhythm: Normal rate.  Pulmonary:     Effort: Pulmonary effort is normal. No respiratory distress.  Musculoskeletal:        General: Normal range of motion.     Cervical back: Tenderness present.  Skin:    General: Skin is warm.     Coloration: Skin is not jaundiced.  Neurological:     Mental Status: He is alert and oriented to person, place, and time.  Psychiatric:        Attention and Perception: Attention normal. He is attentive. He does not perceive auditory or visual hallucinations.        Mood and Affect: Mood is anxious and depressed.        Speech: Speech normal.        Behavior: Behavior normal. Behavior is cooperative.        Thought Content: Thought content normal. Thought content is not paranoid or delusional. Thought content does not include homicidal or suicidal ideation. Thought content does not include homicidal or suicidal plan.        Cognition and Memory: Cognition normal.        Judgment: Judgment normal.    Review of Systems  Constitutional: Negative for chills and fever.  HENT: Negative.    Respiratory: Negative.  Negative for cough, hemoptysis, sputum production and shortness of breath.   Cardiovascular: Negative for chest pain, palpitations and leg swelling.  Gastrointestinal: Negative.   Genitourinary: Negative.   Musculoskeletal: Negative.   Neurological: Negative.   Psychiatric/Behavioral: Positive for depression. Negative for hallucinations, substance abuse and suicidal ideas. The patient is not nervous/anxious.     Blood pressure (!) 142/109, pulse 97, temperature 98.9 F (37.2 C), temperature source Oral, resp. rate 20, SpO2 99 %. There is no height or weight on file to calculate BMI.  Past Psychiatric History:    Is the patient at risk to self? No  Has the patient been a risk to self in the past 6 months? No .    Has the patient been a risk to self within the distant past? No   Is the patient a risk to others? No   Has the patient been a risk to others in the past 6 months? No   Has the patient been a risk to others within the distant past? No   Past Medical History: No past medical history on  file.  Past Surgical History:  Procedure Laterality Date  . CLOSED REDUCTION PATELLAR Left 07/05/2020   Procedure: ATTEMPTED CLOSED REDUCTION PATELLA;  Surgeon: Yolonda Kida, MD;  Location: [facility] OR;  Service: Orthopedics;  Laterality: Left;  . ORIF PATELLA Left 07/05/2020   Procedure: OPEN REDUCTION OF LATERAL PATELLA DISLOCATION;  Surgeon: Yolonda Kida, MD;  Location: [facility] OR;  Service: Orthopedics;  Laterality: Left;    Family History:  Family History  Problem Relation Age of Onset  . Diabetes Father   . Hypertension Other   . Diabetes Other   . Coronary artery disease Other     Social History:  Social History   Socioeconomic History  . Marital status: Single    Spouse name: Not on file  . Number of children: Not on file  . Years of education: Not on file  . Highest education level: Not on file  Occupational History  . Not on file   Tobacco Use  . Smoking status: Never Smoker  . Smokeless tobacco: Never Used  Vaping Use  . Vaping Use: Never used  Substance and Sexual Activity  . Alcohol use: No  . Drug use: No  . Sexual activity: Not on file  Other Topics Concern  . Not on file  Social History Narrative   ** Merged History Encounter **       Social Determinants of Health   Financial Resource Strain: Not on file  Food Insecurity: Not on file  Transportation Needs: Not on file  Physical Activity: Not on file  Stress: Not on file  Social Connections: Not on file  Intimate Partner Violence: Not on file    SDOH:  SDOH Screenings   Alcohol Screen: Not on file  Depression (PHQ2-9): Medium Risk  . PHQ-2 Score: 17  Financial Resource Strain: Not on file  Food Insecurity: Not on file  Housing: Not on file  Physical Activity: Not on file  Social Connections: Not on file  Stress: Not on file  Tobacco Use: Low Risk   . Smoking Tobacco Use: Never Smoker  . Smokeless Tobacco Use: Never Used  Transportation Needs: Not on file    Last Labs:  Admission on 11/28/2020, Discharged on 11/28/2020  Component Date Value Ref Range Status  . SARS Coronavirus 2 by RT PCR 11/28/2020 NEGATIVE  NEGATIVE Final   Comment: (NOTE) SARS-CoV-2 target nucleic acids are NOT DETECTED.  The SARS-CoV-2 RNA is generally detectable in upper respiratory specimens during the acute phase of infection. The lowest concentration of SARS-CoV-2 viral copies this assay can detect is 138 copies/mL. A negative result does not preclude SARS-Cov-2 infection and should not be used as the sole basis for treatment or other patient management decisions. A negative result may occur with  improper specimen collection/handling, submission of specimen other than nasopharyngeal swab, presence of viral mutation(s) within the areas targeted by this assay, and inadequate number of viral copies(<138 copies/mL). A negative result must be combined  with clinical observations, patient history, and epidemiological information. The expected result is Negative.  Fact Sheet for Patients:  BloggerCourse.com  Fact Sheet for Healthcare Providers:  SeriousBroker.it  This test is no                          t yet approved or cleared by the [place] FDA and  has been authorized for detection and/or diagnosis of SARS-CoV-2 by FDA under an Emergency Use Authorization (EUA). This EUA will remain  in effect (meaning this test can be used) for the duration of the COVID-19 declaration under Section 564(b)(1) of the Act, 21 U.S.C.section 360bbb-3(b)(1), unless the authorization is terminated  or revoked sooner.      . Influenza A by PCR 11/28/2020 NEGATIVE  NEGATIVE Final  . Influenza B by PCR 11/28/2020 NEGATIVE  NEGATIVE Final   Comment: (NOTE) The Xpert Xpress SARS-CoV-2/FLU/RSV plus assay is intended as an aid in the diagnosis of influenza from Nasopharyngeal swab specimens and should not be used as a sole basis for treatment. Nasal washings and aspirates are unacceptable for Xpert Xpress SARS-CoV-2/FLU/RSV testing.  Fact Sheet for Patients: BloggerCourse.com  Fact Sheet for Healthcare Providers: SeriousBroker.it  This test is not yet approved or cleared by the [place] FDA and has been authorized for detection and/or diagnosis of SARS-CoV-2 by FDA under an Emergency Use Authorization (EUA). This EUA will remain in effect (meaning this test can be used) for the duration of the COVID-19 declaration under Section 564(b)(1) of the Act, 21 U.S.C. section 360bbb-3(b)(1), unless the authorization is terminated or revoked.  Performed at [facility], [address], [place], [place] [address]   . WBC 11/28/2020 8.2  4.0 - 10.5 K/uL Final  . RBC 11/28/2020 5.72  4.22 - 5.81 MIL/uL Final  . Hemoglobin 11/28/2020 17.6* 13.0  - 17.0 g/dL Final  . HCT 26/37/8588 52.7* 39.0 - 52.0 % Final  . MCV 11/28/2020 92.1  80.0 - 100.0 fL Final  . MCH 11/28/2020 30.8  26.0 - 34.0 pg Final  . MCHC 11/28/2020 33.4  30.0 - 36.0 g/dL Final  . RDW 50/27/7412 15.2  11.5 - 15.5 % Final  . Platelets 11/28/2020 248  150 - 400 K/uL Final  . nRBC 11/28/2020 0.0  0.0 - 0.2 % Final  . Neutrophils Relative % 11/28/2020 55  % Final  . Neutro Abs 11/28/2020 4.6  1.7 - 7.7 K/uL Final  . Lymphocytes Relative 11/28/2020 33  % Final  . Lymphs Abs 11/28/2020 2.7  0.7 - 4.0 K/uL Final  . Monocytes Relative 11/28/2020 9  % Final  . Monocytes Absolute 11/28/2020 0.7  0.1 - 1.0 K/uL Final  . Eosinophils Relative 11/28/2020 2  % Final  . Eosinophils Absolute 11/28/2020 0.1  0.0 - 0.5 K/uL Final  . Basophils Relative 11/28/2020 0  % Final  . Basophils Absolute 11/28/2020 0.0  0.0 - 0.1 K/uL Final  . Immature Granulocytes 11/28/2020 1  % Final  . Abs Immature Granulocytes 11/28/2020 0.04  0.00 - 0.07 K/uL Final   Performed at [facility], [address], [place], [place] [address]  . Sodium 11/28/2020 139  135 - 145 mmol/L Final  . Potassium 11/28/2020 4.0  3.5 - 5.1 mmol/L Final  . Chloride 11/28/2020 106  98 - 111 mmol/L Final  . CO2 11/28/2020 23  22 - 32 mmol/L Final  . Glucose, Bld 11/28/2020 104* 70 - 99 mg/dL Final   Glucose reference range applies only to samples taken after fasting for at least 8 hours.  . BUN 11/28/2020 9  6 - 20 mg/dL Final  . Creatinine, Ser 11/28/2020 0.96  0.61 - 1.24 mg/dL Final  . Calcium 67/20/9470 9.4  8.9 - 10.3 mg/dL Final  . Total Protein 11/28/2020 7.4  6.5 - 8.1 g/dL Final  . Albumin 96/28/3662 4.1  3.5 - 5.0 g/dL Final  . AST 94/76/5465 32  15 - 41 U/L Final  . ALT 11/28/2020 47* 0 -  44 U/L Final  . Alkaline Phosphatase 11/28/2020 81  38 - 126 U/L Final  . Total Bilirubin 11/28/2020 1.1  0.3 - 1.2 mg/dL Final  . GFR, Estimated 11/28/2020 >60  >60 mL/min Final   Comment: (NOTE) Calculated using  the CKD-EPI Creatinine Equation (2021)   . Anion gap 11/28/2020 10  5 - 15 Final   Performed at [facility], [address], [place], [place] [address]  . Hgb A1c MFr Bld 11/28/2020 6.0* 4.8 - 5.6 % Final   Comment: (NOTE) Pre diabetes:          5.7%-6.4%  Diabetes:              >6.4%  Glycemic control for   <7.0% adults with diabetes   . Mean Plasma Glucose 11/28/2020 125.5  mg/dL Final   Performed at [facility], [address] [place], [place] [address]  . Alcohol, Ethyl (B) 11/28/2020 <10  <10 mg/dL Final   Comment: (NOTE) Lowest detectable limit for serum alcohol is 10 mg/dL.  For medical purposes only. Performed at [facility], [address] [place], [place] [address]   . TSH 11/28/2020 2.332  0.350 - 4.500 uIU/mL Final   Comment: Performed by a 3rd Generation assay with a functional sensitivity of <=0.01 uIU/mL. Performed at [facility], [address] [place], [place] [address]   . POC Amphetamine UR 11/28/2020 None Detected  NONE DETECTED (Cut Off Level 1000 ng/mL) Final  . POC Secobarbital (BAR) 11/28/2020 None Detected  NONE DETECTED (Cut Off Level 300 ng/mL) Final  . POC Buprenorphine (BUP) 11/28/2020 None Detected  NONE DETECTED (Cut Off Level 10 ng/mL) Final  . POC Oxazepam (BZO) 11/28/2020 None Detected  NONE DETECTED (Cut Off Level 300 ng/mL) Final  . POC Cocaine UR 11/28/2020 None Detected  NONE DETECTED (Cut Off Level 300 ng/mL) Final  . POC Methamphetamine UR 11/28/2020 None Detected  NONE DETECTED (Cut Off Level 1000 ng/mL) Final  . POC Morphine 11/28/2020 None Detected  NONE DETECTED (Cut Off Level 300 ng/mL) Final  . POC Oxycodone UR 11/28/2020 None Detected  NONE DETECTED (Cut Off Level 100 ng/mL) Final  . POC Methadone UR 11/28/2020 None Detected  NONE DETECTED (Cut Off Level 300 ng/mL) Final  . POC Marijuana UR 11/28/2020 None Detected  NONE DETECTED (Cut Off Level 50 ng/mL) Final  . SARSCOV2ONAVIRUS 2 AG 11/28/2020  NEGATIVE  NEGATIVE Final   Comment: (NOTE) SARS-CoV-2 antigen NOT DETECTED.   Negative results are presumptive.  Negative results do not preclude SARS-CoV-2 infection and should not be used as the sole basis for treatment or other patient management decisions, including infection  control decisions, particularly in the presence of clinical signs and  symptoms consistent with COVID-19, or in those who have been in contact with the virus.  Negative results must be combined with clinical observations, patient history, and epidemiological information. The expected result is Negative.  Fact Sheet for Patients: https://www.jennings-kim.com/  Fact Sheet for Healthcare Providers: https://alexander-rogers.biz/  This test is not yet approved or cleared by the [place] FDA and  has been authorized for detection and/or diagnosis of SARS-CoV-2 by FDA under an Emergency Use Authorization (EUA).  This EUA will remain in effect (meaning this test can be used) for the duration of  the COV                          ID-19 declaration  under Section 564(b)(1) of the Act, 21 U.S.C. section 360bbb-3(b)(1), unless the authorization is terminated or revoked sooner.    . Cholesterol 11/28/2020 188  0 - 200 mg/dL Final  . Triglycerides 11/28/2020 257* <150 mg/dL Final  . HDL 40/98/1191 37* >40 mg/dL Final  . Total CHOL/HDL Ratio 11/28/2020 5.1  RATIO Final  . VLDL 11/28/2020 51* 0 - 40 mg/dL Final  . LDL Cholesterol 11/28/2020 100* 0 - 99 mg/dL Final   Comment:        Total Cholesterol/HDL:CHD Risk Coronary Heart Disease Risk Table                     Men   Women  1/2 Average Risk   3.4   3.3  Average Risk       5.0   4.4  2 X Average Risk   9.6   7.1  3 X Average Risk  23.4   11.0        Use the calculated Patient Ratio above and the CHD Risk Table to determine the patient's CHD Risk.        ATP III CLASSIFICATION (LDL):  <100     mg/dL   Optimal  478-295  mg/dL    Near or Above                    Optimal  130-159  mg/dL   Borderline  621-308  mg/dL   High  >657     mg/dL   Very High Performed at [facility], [address] [place], [place] [address]   Admission on 07/05/2020, Discharged on 07/05/2020  Component Date Value Ref Range Status  . Sodium 07/05/2020 139  135 - 145 mmol/L Final  . Potassium 07/05/2020 5.4* 3.5 - 5.1 mmol/L Final  . Chloride 07/05/2020 105  98 - 111 mmol/L Final  . CO2 07/05/2020 25  22 - 32 mmol/L Final  . Glucose, Bld 07/05/2020 119* 70 - 99 mg/dL Final   Glucose reference range applies only to samples taken after fasting for at least 8 hours.  . BUN 07/05/2020 10  6 - 20 mg/dL Final  . Creatinine, Ser 07/05/2020 1.11  0.61 - 1.24 mg/dL Final  . Calcium 29/52/8413 9.7  8.9 - 10.3 mg/dL Final  . Total Protein 07/05/2020 7.7  6.5 - 8.1 g/dL Final  . Albumin 24/40/1027 4.0  3.5 - 5.0 g/dL Final  . AST 25/36/6440 47* 15 - 41 U/L Final  . ALT 07/05/2020 49* 0 - 44 U/L Final  . Alkaline Phosphatase 07/05/2020 64  38 - 126 U/L Final  . Total Bilirubin 07/05/2020 1.2  0.3 - 1.2 mg/dL Final  . GFR, Estimated 07/05/2020 >60  >60 mL/min Final   Comment: (NOTE) Calculated using the CKD-EPI Creatinine Equation (2021)   . Anion gap 07/05/2020 9  5 - 15 Final   Performed at [facility], [address], [place], [place] [address]  . Sodium 07/05/2020 140  135 - 145 mmol/L Final  . Potassium 07/05/2020 5.2* 3.5 - 5.1 mmol/L Final  . Chloride 07/05/2020 108  98 - 111 mmol/L Final  . BUN 07/05/2020 11  6 - 20 mg/dL Final  . Creatinine, Ser 07/05/2020 1.10  0.61 - 1.24 mg/dL Final  . Glucose, Bld 59/56/3875 119* 70 - 99 mg/dL Final   Glucose reference range applies only to samples taken after fasting for at least 8 hours.  . Calcium, Ion 07/05/2020  1.12* 1.15 - 1.40 mmol/L Final  . TCO2 07/05/2020 31  22 - 32 mmol/L Final  . Hemoglobin 07/05/2020 17.0  13.0 - 17.0 g/dL Final  . HCT 16/04/9603 50.0  39.0 - 52.0 %  Final  . WBC 07/05/2020 7.7  4.0 - 10.5 K/uL Final  . RBC 07/05/2020 5.39  4.22 - 5.81 MIL/uL Final  . Hemoglobin 07/05/2020 16.8  13.0 - 17.0 g/dL Final  . HCT 54/03/8118 51.0  39.0 - 52.0 % Final  . MCV 07/05/2020 94.6  80.0 - 100.0 fL Final  . MCH 07/05/2020 31.2  26.0 - 34.0 pg Final  . MCHC 07/05/2020 32.9  30.0 - 36.0 g/dL Final  . RDW 14/78/2956 14.6  11.5 - 15.5 % Final  . Platelets 07/05/2020 218  150 - 400 K/uL Final  . nRBC 07/05/2020 0.0  0.0 - 0.2 % Final   Performed at [facility], [address], [place], [place] [address]  . Alcohol, Ethyl (B) 07/05/2020 <10  <10 mg/dL Final   Comment: (NOTE) Lowest detectable limit for serum alcohol is 10 mg/dL.  For medical purposes only. Performed at [facility], [address], [place], [place] [address]   . Lactic Acid, Venous 07/05/2020 1.9  0.5 - 1.9 mmol/L Final   Performed at [facility], [address] [place], [place] [address]  . Prothrombin Time 07/05/2020 12.0  11.4 - 15.2 seconds Final  . INR 07/05/2020 0.9  0.8 - 1.2 Final   Comment: (NOTE) INR goal varies based on device and disease states. Performed at [facility], [address], [place], [place] [address]   . Blood Bank Specimen 07/05/2020 SAMPLE AVAILABLE FOR TESTING   Final  . Sample Expiration 07/05/2020    Final                   Value:07/06/2020,2359 Performed at [facility], [address] [place], [place] [address]   . SARS Coronavirus 2 by RT PCR 07/05/2020 NEGATIVE  NEGATIVE Final   Comment: (NOTE) SARS-CoV-2 target nucleic acids are NOT DETECTED.  The SARS-CoV-2 RNA is generally detectable in upper respiratory specimens during the acute phase of infection. The lowest concentration of SARS-CoV-2 viral copies this assay can detect is 138 copies/mL. A negative result does not preclude SARS-Cov-2 infection and should not be used as the sole basis for treatment or other patient management decisions. A negative  result may occur with  improper specimen collection/handling, submission of specimen other than nasopharyngeal swab, presence of viral mutation(s) within the areas targeted by this assay, and inadequate number of viral copies(<138 copies/mL). A negative result must be combined with clinical observations, patient history, and epidemiological information. The expected result is Negative.  Fact Sheet for Patients:  BloggerCourse.com  Fact Sheet for Healthcare Providers:  SeriousBroker.it  This test is no                          t yet approved or cleared by the [place] FDA and  has been authorized for detection and/or diagnosis of SARS-CoV-2 by FDA under an Emergency Use Authorization (EUA). This EUA will remain  in effect (meaning this test can be used) for the duration of the COVID-19 declaration under Section 564(b)(1) of the Act, 21 U.S.C.section 360bbb-3(b)(1), unless the authorization is terminated  or revoked sooner.      . Influenza A by PCR 07/05/2020 NEGATIVE  NEGATIVE  Final  . Influenza B by PCR 07/05/2020 NEGATIVE  NEGATIVE Final   Comment: (NOTE) The Xpert Xpress SARS-CoV-2/FLU/RSV plus assay is intended as an aid in the diagnosis of influenza from Nasopharyngeal swab specimens and should not be used as a sole basis for treatment. Nasal washings and aspirates are unacceptable for Xpert Xpress SARS-CoV-2/FLU/RSV testing.  Fact Sheet for Patients: BloggerCourse.com  Fact Sheet for Healthcare Providers: SeriousBroker.it  This test is not yet approved or cleared by the [place] FDA and has been authorized for detection and/or diagnosis of SARS-CoV-2 by FDA under an Emergency Use Authorization (EUA). This EUA will remain in effect (meaning this test can be used) for the duration of the COVID-19 declaration under Section 564(b)(1) of the Act, 21 U.S.C. section  360bbb-3(b)(1), unless the authorization is terminated or revoked.  Performed at [facility], [address] [place], [place] [address]     Allergies: Patient has no known allergies.  PTA Medications: (Not in a hospital admission)   Medical Decision Making  Patient with ongoing suicidal ideation with plan to overdose and worsening depresion; patient is unable to contract for safety at this time.  Recommend inpatient psychiatric treatment. Will admit patient to [facility] for continuous assessment while waiting for inpatient psychiatric bed to become available.  Recommendations  Based on my evaluation the patient does not appear to have an emergency medical condition.  Maricela Bo, NP 12/01/20  9:46 PM

## 2021-01-10 ENCOUNTER — Emergency Department (HOSPITAL_COMMUNITY)
Admission: EM | Admit: 2021-01-10 | Discharge: 2021-01-10 | Disposition: A | Discharge status: Home / Self Care | Payer: BC Managed Care – PPO | Attending: Emergency Medicine | Admitting: Emergency Medicine

## 2021-01-10 ENCOUNTER — Encounter (HOSPITAL_COMMUNITY): Payer: Self-pay

## 2021-01-10 ENCOUNTER — Other Ambulatory Visit: Payer: Self-pay

## 2021-01-10 DIAGNOSIS — R21 Rash and other nonspecific skin eruption: Secondary | ICD-10-CM | POA: Insufficient documentation

## 2021-01-10 MED ORDER — MUPIROCIN 2 % EX OINT: 1.0000 "application " | TOPICAL_OINTMENT | Freq: Two times a day (BID) | CUTANEOUS | 0 refills | Status: DC

## 2021-01-10 MED ORDER — VALACYCLOVIR HCL 1 G PO TABS: 1000.0000 mg | ORAL_TABLET | Freq: Three times a day (TID) | ORAL | 0 refills | Status: DC

## 2021-01-10 NOTE — Discharge Instructions (Addendum)
Take medications as written.  Follow-up with your doctor for repeat evaluation within 4 to 5 days.  Return immediately back to the ER if you have fevers worsening symptoms or any additional concerns.

## 2021-01-10 NOTE — ED Triage Notes (Signed)
Patient reports a rash above his upper lip that is getting larger and itching. Noticed it Sunday.    A/[facility] Amb[facility] in triage

## 2021-01-10 NOTE — ED Provider Notes (Signed)
[facility]-EMERGENCY DEPT Provider Note   CSN: [id number] Arrival date & time: 01/10/21  0940     History Chief Complaint  Patient presents with   Rash    Yuval Rubens is a 40 y.o. male.  Patient presents with chief complaint of rash on his upper lip.  He states has been there for about 3 days.  Has been applying Neosporin cream on it but has not been improving so he presents to the ER.  Otherwise denies any fevers or cough no vomiting or diarrhea.  States that it stings slightly to the touch.  No history of recent sexual contacts.      History reviewed. No pertinent past medical history.  There are no problems to display for this patient.   Past Surgical History:  Procedure Laterality Date   CLOSED REDUCTION PATELLAR Left 07/05/2020   Procedure: ATTEMPTED CLOSED REDUCTION PATELLA;  Surgeon: Yolonda Kida, MD;  Location: [facility] OR;  Service: Orthopedics;  Laterality: Left;   ORIF PATELLA Left 07/05/2020   Procedure: OPEN REDUCTION OF LATERAL PATELLA DISLOCATION;  Surgeon: Yolonda Kida, MD;  Location: [facility] OR;  Service: Orthopedics;  Laterality: Left;       Family History  Problem Relation Age of Onset   Diabetes Father    Hypertension Other    Diabetes Other    Coronary artery disease Other     Social History   Tobacco Use   Smoking status: Never   Smokeless tobacco: Never  Vaping Use   Vaping Use: Never used  Substance Use Topics   Alcohol use: No   Drug use: No    Home Medications Prior to Admission medications   Medication Sig Start Date End Date Taking? Authorizing Provider  mupirocin ointment (BACTROBAN) 2 % Apply 1 application topically 2 (two) times daily. Apply to upper lip x 7 days twice daily 01/10/21  Yes Walterhill, Eustace Moore, MD  valACYclovir (VALTREX) 1000 MG tablet Take 1 tablet (1,000 mg total) by mouth 3 (three) times daily. 01/10/21  Yes Dustee Bottenfield, Eustace Moore, MD  cyclobenzaprine (FLEXERIL) 10 MG tablet Take 1 tablet (10  mg total) by mouth 2 (two) times daily as needed for muscle spasms. 06/09/18   Fawze, Mina A, PA-C  escitalopram (LEXAPRO) 10 MG tablet Take 1 tablet (10 mg total) by mouth daily. 11/28/20   Estella Husk, MD  ibuprofen (ADVIL,MOTRIN) 200 MG tablet Take 200 mg by mouth every 6 (six) hours as needed.    [provider]  methocarbamol (ROBAXIN) 500 MG tablet Take 1 tablet (500 mg total) by mouth every 6 (six) hours as needed for muscle spasms. 07/05/20   Yolonda Kida, MD  naproxen (NAPROSYN) 375 MG tablet Take 1 tablet (375 mg total) by mouth 2 (two) times daily. 10/29/16   McDonald, Mia A, PA-C    Allergies    Patient has no known allergies.  Review of Systems   Review of Systems  Constitutional:  Negative for fever.  HENT:  Negative for ear pain and sore throat.   Eyes:  Negative for pain.  Respiratory:  Negative for cough.   Cardiovascular:  Negative for chest pain.  Gastrointestinal:  Negative for abdominal pain.  Genitourinary:  Negative for flank pain.  Musculoskeletal:  Negative for back pain.  Skin:  Positive for rash. Negative for color change.  Neurological:  Negative for syncope.  All other systems reviewed and are negative.  Physical Exam Updated Vital Signs BP (!) 186/137 (BP Location:  Left Arm)   Pulse (!) 134   Temp 98.6 F (37 C) (Oral)   Resp 18   SpO2 100%   Physical Exam Constitutional:      General: He is not in acute distress.    Appearance: He is well-developed.  HENT:     Head: Normocephalic.     Nose: Nose normal.  Eyes:     Extraocular Movements: Extraocular movements intact.  Cardiovascular:     Rate and Rhythm: Normal rate.  Pulmonary:     Effort: Pulmonary effort is normal.  Skin:    Coloration: Skin is not jaundiced.     Comments: Patient has a 1 x 1 cm rash on the right side of the upper lip.  It has some honeycomb crust surrounding the lesion.  On closer inspection appears to be vesicular components.  Concern for  zoster versus impetigo.  No involvement of the nose or ocular regions.  Neurological:     Mental Status: He is alert. Mental status is at baseline.    ED Results / Procedures / Treatments   Labs (all labs ordered are listed, but only abnormal results are displayed) Labs Reviewed - No data to display  EKG None  Radiology No results found.  Procedures Procedures   Medications Ordered in ED Medications - No data to display  ED Course  I have reviewed the triage vital signs and the nursing notes.  Pertinent labs & imaging results that were available during my care of the patient were reviewed by me and considered in my medical decision making (see chart for details).    MDM Rules/Calculators/A&P                           Patient initially presented tachycardic in the 130s per nursing notation, however at my bedside his heart rate is about 105 bpm.  Looking at his past history he typically has mildly elevated heart rates.  Remainder of vitals are otherwise stable.  Clinically the patient is comfortable and well-appearing. Patient presents with rash and upper lip for 3 days.  Differential includes impetigo versus zoster among other differentials considered.  Will advise mupirocin topical cream for 1 week as well as a course of valacyclovir.  Recommending outpatient follow-up with his doctor in 3 to 4 days for repeat evaluation.  Advised immediate return however for worsening symptoms or fevers.  Final Clinical Impression(s) / ED Diagnoses Final diagnoses:  Rash and nonspecific skin eruption    Rx / DC Orders ED Discharge Orders          Ordered    valACYclovir (VALTREX) 1000 MG tablet  3 times daily        01/10/21 1352    mupirocin ointment (BACTROBAN) 2 %  2 times daily        01/10/21 1352             Cheryll Cockayne, MD 01/10/21 1356

## 2021-02-14 ENCOUNTER — Other Ambulatory Visit: Payer: Self-pay

## 2021-02-14 ENCOUNTER — Encounter (HOSPITAL_COMMUNITY): Payer: Self-pay

## 2021-02-14 ENCOUNTER — Emergency Department (HOSPITAL_COMMUNITY)
Admission: EM | Admit: 2021-02-14 | Discharge: 2021-02-14 | Discharge status: Against Medical Advice | Payer: BC Managed Care – PPO | Attending: Emergency Medicine | Admitting: Emergency Medicine

## 2021-02-14 ENCOUNTER — Emergency Department (HOSPITAL_COMMUNITY): Payer: BC Managed Care – PPO

## 2021-02-14 DIAGNOSIS — J029 Acute pharyngitis, unspecified: Secondary | ICD-10-CM | POA: Diagnosis present

## 2021-02-14 DIAGNOSIS — R000 Tachycardia, unspecified: Secondary | ICD-10-CM | POA: Insufficient documentation

## 2021-02-14 DIAGNOSIS — Z20822 Contact with and (suspected) exposure to covid-19: Secondary | ICD-10-CM | POA: Diagnosis not present

## 2021-02-14 DIAGNOSIS — I10 Essential (primary) hypertension: Secondary | ICD-10-CM | POA: Diagnosis not present

## 2021-02-14 DIAGNOSIS — J111 Influenza due to unidentified influenza virus with other respiratory manifestations: Secondary | ICD-10-CM | POA: Diagnosis not present

## 2021-02-14 DIAGNOSIS — R6889 Other general symptoms and signs: Secondary | ICD-10-CM

## 2021-02-14 LAB — RESP PANEL BY RT-PCR (FLU A&B, COVID) ARPGX2
Influenza A by PCR: NEGATIVE
Influenza B by PCR: NEGATIVE
SARS Coronavirus 2 by RT PCR: NEGATIVE

## 2021-02-14 LAB — GROUP A STREP BY PCR: Group A Strep by PCR: NOT DETECTED

## 2021-02-14 MED ORDER — SODIUM CHLORIDE 0.9 % IV BOLUS: 1000.0000 mL | Freq: Once | INTRAVENOUS | Status: DC

## 2021-02-14 MED ORDER — ACETAMINOPHEN 325 MG PO TABS
650.0000 mg | ORAL_TABLET | Freq: Once | ORAL | Status: AC | PRN
  Administered 2021-02-14: 650 mg via ORAL
  Filled 2021-02-14: qty 2

## 2021-02-14 MED ORDER — HYDRALAZINE HCL 25 MG PO TABS: 50.0000 mg | ORAL_TABLET | Freq: Once | ORAL | Status: DC

## 2021-02-14 MED ORDER — AMLODIPINE BESYLATE 5 MG PO TABS: 5.0000 mg | ORAL_TABLET | Freq: Every day | ORAL | 0 refills | Status: DC

## 2021-02-14 NOTE — ED Triage Notes (Signed)
Pt reports sore throat, low grade fever, cough, chills, and generalized body aches that began yesterday.

## 2021-02-14 NOTE — ED Provider Notes (Signed)
[facility]-EMERGENCY DEPT Provider Note   CSN: [id number] Arrival date & time: 02/14/21  1133     History Chief Complaint  Patient presents with   Sore Throat   Cough    Bobby Jones is a 40 y.o. male with no prior past medical history the presents to the emerged from today for URI symptoms.  Patient states that 2 days ago he started feeling ill, states that he is having cough, sore throat, congestion, myalgias.  Has been taking anything for this.  Denies any sick contacts.  Has been vaccinated against COVID.  Patient states that he has been eating and drinking normally, denies any nausea or vomiting or diarrhea.  Patient states that he has been feeling worse today, therefore came to get tested for COVID.  Denies any chest pain or shortness of breath difficulty breathing, difficulty swallowing, trismus, drooling.  Denies any rashes or skin lesions, tick bites.   HPI     History reviewed. No pertinent past medical history.  There are no problems to display for this patient.   Past Surgical History:  Procedure Laterality Date   CLOSED REDUCTION PATELLAR Left 07/05/2020   Procedure: ATTEMPTED CLOSED REDUCTION PATELLA;  Surgeon: Yolonda Kida, MD;  Location: [facility] OR;  Service: Orthopedics;  Laterality: Left;   ORIF PATELLA Left 07/05/2020   Procedure: OPEN REDUCTION OF LATERAL PATELLA DISLOCATION;  Surgeon: Yolonda Kida, MD;  Location: [facility] OR;  Service: Orthopedics;  Laterality: Left;       Family History  Problem Relation Age of Onset   Diabetes Father    Hypertension Other    Diabetes Other    Coronary artery disease Other     Social History   Tobacco Use   Smoking status: Never   Smokeless tobacco: Never  Vaping Use   Vaping Use: Never used  Substance Use Topics   Alcohol use: No   Drug use: No    Home Medications Prior to Admission medications   Medication Sig Start Date End Date Taking? Authorizing Provider  amLODipine  (NORVASC) 5 MG tablet Take 1 tablet (5 mg total) by mouth daily. 02/14/21 03/16/21 Yes Tammy Wickliffe, PA-C  cyclobenzaprine (FLEXERIL) 10 MG tablet Take 1 tablet (10 mg total) by mouth 2 (two) times daily as needed for muscle spasms. 06/09/18   Fawze, Mina A, PA-C  escitalopram (LEXAPRO) 10 MG tablet Take 1 tablet (10 mg total) by mouth daily. 11/28/20   Estella Husk, MD  ibuprofen (ADVIL,MOTRIN) 200 MG tablet Take 200 mg by mouth every 6 (six) hours as needed.    [provider]  methocarbamol (ROBAXIN) 500 MG tablet Take 1 tablet (500 mg total) by mouth every 6 (six) hours as needed for muscle spasms. 07/05/20   Yolonda Kida, MD  mupirocin ointment (BACTROBAN) 2 % Apply 1 application topically 2 (two) times daily. Apply to upper lip x 7 days twice daily 01/10/21   Cheryll Cockayne, MD  naproxen (NAPROSYN) 375 MG tablet Take 1 tablet (375 mg total) by mouth 2 (two) times daily. 10/29/16   McDonald, Mia A, PA-C  valACYclovir (VALTREX) 1000 MG tablet Take 1 tablet (1,000 mg total) by mouth 3 (three) times daily. 01/10/21   Cheryll Cockayne, MD    Allergies    Patient has no known allergies.  Review of Systems   Review of Systems  Constitutional:  Negative for chills, diaphoresis, fatigue and fever.  HENT:  Negative for congestion, sore throat and trouble  swallowing.   Eyes:  Negative for pain and visual disturbance.  Respiratory:  Negative for cough, shortness of breath and wheezing.   Cardiovascular:  Negative for chest pain, palpitations and leg swelling.  Gastrointestinal:  Negative for abdominal distention, abdominal pain, diarrhea, nausea and vomiting.  Genitourinary:  Negative for difficulty urinating.  Musculoskeletal:  Negative for back pain, neck pain and neck stiffness.  Skin:  Negative for pallor.  Neurological:  Negative for dizziness, speech difficulty, weakness and headaches.  Psychiatric/Behavioral:  Negative for confusion.    Physical Exam Updated Vital  Signs BP (!) 161/111   Pulse (!) 124   Temp 99.4 F (37.4 C) (Oral)   Resp 17   SpO2 95%   Physical Exam Constitutional:      General: He is not in acute distress.    Appearance: Normal appearance. He is not ill-appearing, toxic-appearing or diaphoretic.     Comments: Patient without acute respiratory stress.  Patient is sitting comfortably in bed, no tripoding, use of accessory muscles.  Patient is speaking to me in full sentences.  Handling secretions well.  HENT:     Head: Normocephalic and atraumatic.     Jaw: There is normal jaw occlusion. No trismus, swelling or malocclusion.     Nose: Congestion present. No rhinorrhea.     Right Sinus: No maxillary sinus tenderness or frontal sinus tenderness.     Left Sinus: No maxillary sinus tenderness or frontal sinus tenderness.     Mouth/Throat:     Mouth: Mucous membranes are moist. No oral lesions.     Dentition: Normal dentition.     Tongue: No lesions.     Palate: No mass and lesions.     Pharynx: Oropharynx is clear. Uvula midline. No pharyngeal swelling, oropharyngeal exudate, posterior oropharyngeal erythema or uvula swelling.     Tonsils: No tonsillar exudate or tonsillar abscesses. 1+ on the right. 1+ on the left.     Comments: Patient without tonsillar enlargement or exudate.  No signs of peritonsillar abscess, palate without any tenderness or masses palpated.  No swelling under the tongue, uvula is midline without any inflammation. Eyes:     General: No visual field deficit.       Right eye: No discharge.        Left eye: No discharge.     Extraocular Movements: Extraocular movements intact.     Conjunctiva/sclera: Conjunctivae normal.     Pupils: Pupils are equal, round, and reactive to light.  Cardiovascular:     Rate and Rhythm: Regular rhythm. Tachycardia present.     Pulses: Normal pulses.     Heart sounds: Normal heart sounds. No murmur heard.   No friction rub. No gallop.  Pulmonary:     Effort: Pulmonary effort  is normal. No respiratory distress.     Breath sounds: Normal breath sounds. No stridor. No wheezing, rhonchi or rales.  Chest:     Chest wall: No tenderness.  Abdominal:     General: Abdomen is flat. Bowel sounds are normal. There is no distension.     Palpations: Abdomen is soft.     Tenderness: There is no abdominal tenderness. There is no right CVA tenderness or left CVA tenderness.  Musculoskeletal:        General: No swelling or tenderness. Normal range of motion.     Cervical back: Normal range of motion. No rigidity or tenderness.     Right lower leg: No edema.     Left lower leg:  No edema.  Lymphadenopathy:     Cervical: No cervical adenopathy.  Skin:    General: Skin is warm and dry.     Capillary Refill: Capillary refill takes less than 2 seconds.     Findings: No erythema or rash.  Neurological:     General: No focal deficit present.     Mental Status: He is alert and oriented to person, place, and time.     Cranial Nerves: Cranial nerves are intact. No cranial nerve deficit or facial asymmetry.     Motor: Motor function is intact. No weakness.     Coordination: Coordination is intact.     Gait: Gait is intact. Gait normal.  Psychiatric:        Mood and Affect: Mood normal.    ED Results / Procedures / Treatments   Labs (all labs ordered are listed, but only abnormal results are displayed) Labs Reviewed  GROUP A STREP BY PCR  RESP PANEL BY RT-PCR (FLU A&B, COVID) [id number]  COMPREHENSIVE METABOLIC PANEL  CBC WITH DIFFERENTIAL/PLATELET    EKG None  Radiology No results found.  Procedures Procedures   Medications Ordered in ED Medications  sodium chloride 0.9 % bolus 1,000 mL (has no administration in time range)  hydrALAZINE (APRESOLINE) tablet 50 mg (has no administration in time range)  acetaminophen (TYLENOL) tablet 650 mg (650 mg Oral Given 02/14/21 1201)    ED Course  I have reviewed the triage vital signs and the nursing notes.  Pertinent labs &  imaging results that were available during my care of the patient were reviewed by me and considered in my medical decision making (see chart for details).    MDM Rules/Calculators/A&P                           Patient presents to the emerged from today for URI symptoms, temperature today 100.8 and pulse 138.  This most likely viral syndrome, Tylenol given for temperature.  Upon reevaluation patient is still tachycardic, did discuss that patient will most likely need blood work and IV fluids initiated at this time due to high heart rate.  Patient denies any shortness of breath or chest pain.    Patient states that he wants to leave AMA.  Did discuss risks of this, patient is able to make his own decision at this time.We will also prescribe antihypertensive, patient states that he has been trying to get in with primary care to get blood pressure under control, however has not been able to recently.  Did discuss that without any blood work will not be able to know baseline kidney function, however patient states that he still wants blood pressure medication.  Final Clinical Impression(s) / ED Diagnoses Final diagnoses:  Flu-like symptoms  Hypertension, unspecified type    Rx / DC Orders ED Discharge Orders          Ordered    amLODipine (NORVASC) 5 MG tablet  Daily        02/14/21 1410             Farrel Gordon, PA-C 02/14/21 1507    Jacalyn Lefevre, MD 02/14/21 1526

## 2021-02-14 NOTE — ED Notes (Signed)
Patient refusing care at this time. PA aware. Will have patient sign AMA form per PA request.

## 2021-02-14 NOTE — Discharge Instructions (Addendum)
You are leaving AGAINST MEDICAL ADVICE.  Please feel free to come back to the emergency department.  I also prescribed you blood pressure medication as we spoke about is extremely important that you take these, as we discussed since I do not know your kidney function is imperative that you follow-up with your primary care doctor to get this evaluated, you might need a different prescription of blood pressure medication.  If you do not have a primary care doctor he can make an appointment at [facility].  Please speak to a pharmacist about any new medications prescribed today in regards to side effects or interactions with other medications.

## 2021-05-24 ENCOUNTER — Other Ambulatory Visit: Payer: Self-pay

## 2021-05-24 ENCOUNTER — Ambulatory Visit
Admission: EM | Admit: 2021-05-24 | Discharge: 2021-05-24 | Disposition: A | Discharge status: Home / Self Care | Payer: BC Managed Care – PPO | Attending: Internal Medicine | Admitting: Internal Medicine

## 2021-05-24 DIAGNOSIS — J069 Acute upper respiratory infection, unspecified: Secondary | ICD-10-CM | POA: Insufficient documentation

## 2021-05-24 DIAGNOSIS — J029 Acute pharyngitis, unspecified: Secondary | ICD-10-CM | POA: Insufficient documentation

## 2021-05-24 DIAGNOSIS — R6889 Other general symptoms and signs: Secondary | ICD-10-CM | POA: Diagnosis not present

## 2021-05-24 HISTORY — DX: Hyperlipidemia, unspecified: E78.5

## 2021-05-24 HISTORY — DX: Essential (primary) hypertension: I10

## 2021-05-24 LAB — POCT RAPID STREP A (OFFICE): Rapid Strep A Screen: NEGATIVE

## 2021-05-24 LAB — POCT INFLUENZA A/B
Influenza A, POC: NEGATIVE
Influenza B, POC: NEGATIVE

## 2021-05-24 NOTE — ED Triage Notes (Signed)
Pt c/o body aches, chills, cough, nasal congestion with drainage, headache, diarrhea.   Denies nausea, vomiting, constipation.   Onset 3-4 days ago

## 2021-05-24 NOTE — Discharge Instructions (Signed)
You likely having a viral upper respiratory infection. We recommended symptom control. I expect your symptoms to start improving in the next 1-2 weeks.   1. Take a daily allergy pill/anti-histamine like Zyrtec, Claritin, or Store brand consistently for 2 weeks  2. For congestion you may try an oral decongestant like Mucinex or Coricidin HBP. You may also try intranasal flonase nasal spray or saline irrigations (neti pot, sinus cleanse)  3. For your sore throat you may try cepacol lozenges, salt water gargles, throat spray. Treatment of congestion may also help your sore throat.  4. For cough you may try Robitussen  5. Take Tylenol or Ibuprofen to help with pain/inflammation  6. Stay hydrated, drink plenty of fluids to keep throat coated and less irritated  Honey Tea For cough/sore throat try using a honey-based tea. Use 3 teaspoons of honey with juice squeezed from half lemon. Place shaved pieces of ginger into 1/2-1 cup of water and warm over stove top. Then mix the ingredients and repeat every 4 hours as needed.   Your rapid flu test and rapid strep test were negative.  Throat culture and COVID-19 viral swab are pending.  We will call if they are positive.

## 2021-05-24 NOTE — ED Provider Notes (Signed)
[facility]    CSN: [id number] Arrival date & time: 05/24/21  1656      History   Chief Complaint Chief Complaint  Patient presents with   Cough    HPI [facility] is a 40 y.o. male.   Patient presents with nonproductive cough, nasal congestion, sore throat, chills, body aches that  have been present for approximately 5 days.  Denies any known fevers or sick contacts.  Denies chest pain, shortness of breath, nausea, vomiting, abdominal pain.  Patient does have some diarrhea but attributes this to his daily prescription medications.  Has not yet taken any medications help alleviate symptoms.   Cough  Past Medical History:  Diagnosis Date   Hyperlipidemia    Hypertension     There are no problems to display for this patient.   Past Surgical History:  Procedure Laterality Date   CLOSED REDUCTION PATELLAR Left 07/05/2020   Procedure: ATTEMPTED CLOSED REDUCTION PATELLA;  Surgeon: Yolonda Kida, MD;  Location: [facility] OR;  Service: Orthopedics;  Laterality: Left;   ORIF PATELLA Left 07/05/2020   Procedure: OPEN REDUCTION OF LATERAL PATELLA DISLOCATION;  Surgeon: Yolonda Kida, MD;  Location: [facility] OR;  Service: Orthopedics;  Laterality: Left;       Home Medications    Prior to Admission medications   Medication Sig Start Date End Date Taking? Authorizing Provider  amLODipine (NORVASC) 5 MG tablet Take 1 tablet (5 mg total) by mouth daily. 02/14/21 03/16/21  Farrel Gordon, PA-C  cyclobenzaprine (FLEXERIL) 10 MG tablet Take 1 tablet (10 mg total) by mouth 2 (two) times daily as needed for muscle spasms. 06/09/18   Fawze, Mina A, PA-C  escitalopram (LEXAPRO) 10 MG tablet Take 1 tablet (10 mg total) by mouth daily. 11/28/20   Estella Husk, MD  hydrochlorothiazide (HYDRODIURIL) 12.5 MG tablet Take 12.5 mg by mouth daily. 05/21/21   [provider]  ibuprofen (ADVIL,MOTRIN) 200 MG tablet Take 200 mg by mouth every 6 (six) hours as needed.     [provider]  methocarbamol (ROBAXIN) 500 MG tablet Take 1 tablet (500 mg total) by mouth every 6 (six) hours as needed for muscle spasms. 07/05/20   Yolonda Kida, MD  mupirocin ointment (BACTROBAN) 2 % Apply 1 application topically 2 (two) times daily. Apply to upper lip x 7 days twice daily 01/10/21   Cheryll Cockayne, MD  naproxen (NAPROSYN) 375 MG tablet Take 1 tablet (375 mg total) by mouth 2 (two) times daily. 10/29/16   McDonald, Mia A, PA-C  pravastatin (PRAVACHOL) 10 MG tablet Take 10 mg by mouth daily. 05/21/21   [provider]  propranolol (INDERAL) 40 MG tablet Take 40 mg by mouth 3 (three) times daily. 05/12/21   [provider]  valACYclovir (VALTREX) 1000 MG tablet Take 1 tablet (1,000 mg total) by mouth 3 (three) times daily. 01/10/21   Cheryll Cockayne, MD    Family History Family History  Problem Relation Age of Onset   Diabetes Father    Hypertension Other    Diabetes Other    Coronary artery disease Other     Social History Social History   Tobacco Use   Smoking status: Never   Smokeless tobacco: Never  Vaping Use   Vaping Use: Never used  Substance Use Topics   Alcohol use: No   Drug use: No     Allergies   Patient has no known allergies.   Review of Systems Review of Systems  Per HPI  Physical Exam Triage Vital Signs ED Triage Vitals  Enc Vitals Group     BP 05/24/21 1743 (!) 140/94     Pulse Rate 05/24/21 1743 84     Resp 05/24/21 1743 18     Temp 05/24/21 1743 99.2 F (37.3 C)     Temp Source 05/24/21 1743 Oral     SpO2 05/24/21 1743 97 %     Weight --      Height --      Head Circumference --      Peak Flow --      Pain Score 05/24/21 1744 0     Pain Loc --      Pain Edu? --      Excl. in GC? --    No data found.  Updated Vital Signs BP (!) 140/94 (BP Location: Left Arm)   Pulse 84   Temp 99.2 F (37.3 C) (Oral)   Resp 18   SpO2 97%   Visual Acuity Right Eye Distance:   Left Eye Distance:    Bilateral Distance:    Right Eye Near:   Left Eye Near:    Bilateral Near:     Physical Exam Constitutional:      General: He is not in acute distress.    Appearance: Normal appearance. He is not toxic-appearing or diaphoretic.  HENT:     Head: Normocephalic and atraumatic.     Right Ear: Tympanic membrane and ear canal normal.     Left Ear: Tympanic membrane and ear canal normal.     Nose: Congestion present.     Mouth/Throat:     Mouth: Mucous membranes are moist.     Pharynx: Posterior oropharyngeal erythema present.  Eyes:     Extraocular Movements: Extraocular movements intact.     Conjunctiva/sclera: Conjunctivae normal.     Pupils: Pupils are equal, round, and reactive to light.  Cardiovascular:     Rate and Rhythm: Normal rate and regular rhythm.     Pulses: Normal pulses.     Heart sounds: Normal heart sounds.  Pulmonary:     Effort: Pulmonary effort is normal. No respiratory distress.     Breath sounds: Normal breath sounds. No wheezing.  Abdominal:     General: Abdomen is flat. Bowel sounds are normal.     Palpations: Abdomen is soft.  Musculoskeletal:        General: Normal range of motion.     Cervical back: Normal range of motion.  Skin:    General: Skin is warm and dry.  Neurological:     General: No focal deficit present.     Mental Status: He is alert and oriented to person, place, and time. Mental status is at baseline.  Psychiatric:        Mood and Affect: Mood normal.        Behavior: Behavior normal.     UC Treatments / Results  Labs (all labs ordered are listed, but only abnormal results are displayed) Labs Reviewed  CULTURE, GROUP A STREP ([facility])  NOVEL CORONAVIRUS, NAA  POCT INFLUENZA A/B  POCT RAPID STREP A (OFFICE)    EKG   Radiology No results found.  Procedures Procedures (including critical Jones time)  Medications Ordered in UC Medications - No data to display  Initial Impression / Assessment and Plan / UC Course  I  have reviewed the triage vital signs and the nursing notes.  Pertinent labs & imaging results that were available  during my Jones of the patient were reviewed by me and considered in my medical decision making (see chart for details).     Patient presents with symptoms likely from a viral upper respiratory infection. Differential includes bacterial pneumonia, sinusitis, allergic rhinitis, COVID-19. Do not suspect underlying cardiopulmonary process. Symptoms seem unlikely related to ACS, CHF or COPD exacerbations, pneumonia, pneumothorax. Patient is nontoxic appearing and not in need of emergent medical intervention.  Rapid flu and rapid strep test was negative.  COVID-19 viral swab pending.  Recommended symptom control with over the counter medications that are safe with blood pressure including Coricidin HBP: Daily oral anti-histamine, Oral decongestant or IN corticosteroid, saline irrigations, cepacol lozenges, Robitussin, Delsym, honey tea.  Return if symptoms fail to improve in 1-2 weeks or you develop shortness of breath, chest pain, severe headache. Patient states understanding and is agreeable.  Discharged with PCP followup.  Final Clinical Impressions(s) / UC Diagnoses   Final diagnoses:  Viral upper respiratory tract infection with cough  Sore throat  Flu-like symptoms     Discharge Instructions      You likely having a viral upper respiratory infection. We recommended symptom control. I expect your symptoms to start improving in the next 1-2 weeks.   1. Take a daily allergy pill/anti-histamine like Zyrtec, Claritin, or Store brand consistently for 2 weeks  2. For congestion you may try an oral decongestant like Mucinex or Coricidin HBP. You may also try intranasal flonase nasal spray or saline irrigations (neti pot, sinus cleanse)  3. For your sore throat you may try cepacol lozenges, salt water gargles, throat spray. Treatment of congestion may also help your sore  throat.  4. For cough you may try Robitussen  5. Take Tylenol or Ibuprofen to help with pain/inflammation  6. Stay hydrated, drink plenty of fluids to keep throat coated and less irritated  Honey Tea For cough/sore throat try using a honey-based tea. Use 3 teaspoons of honey with juice squeezed from half lemon. Place shaved pieces of ginger into 1/2-1 cup of water and warm over stove top. Then mix the ingredients and repeat every 4 hours as needed.   Your rapid flu test and rapid strep test were negative.  Throat culture and COVID-19 viral swab are pending.  We will call if they are positive.     ED Prescriptions   None    PDMP not reviewed this encounter.   Gustavus Bryant, [place] 05/24/21 [phone]

## 2021-05-25 LAB — SARS-COV-2, NAA 2 DAY TAT

## 2021-05-25 LAB — NOVEL CORONAVIRUS, NAA: SARS-CoV-2, NAA: NOT DETECTED

## 2021-05-27 LAB — CULTURE, GROUP A STREP (THRC)

## 2021-05-29 ENCOUNTER — Other Ambulatory Visit: Payer: Self-pay

## 2021-05-29 ENCOUNTER — Emergency Department (HOSPITAL_COMMUNITY)
Admission: EM | Admit: 2021-05-29 | Discharge: 2021-05-29 | Disposition: A | Discharge status: Home / Self Care | Payer: BC Managed Care – PPO | Attending: Emergency Medicine | Admitting: Emergency Medicine

## 2021-05-29 ENCOUNTER — Emergency Department (HOSPITAL_COMMUNITY): Payer: BC Managed Care – PPO

## 2021-05-29 ENCOUNTER — Encounter (HOSPITAL_COMMUNITY): Payer: Self-pay

## 2021-05-29 DIAGNOSIS — J40 Bronchitis, not specified as acute or chronic: Secondary | ICD-10-CM | POA: Diagnosis not present

## 2021-05-29 DIAGNOSIS — Z20822 Contact with and (suspected) exposure to covid-19: Secondary | ICD-10-CM | POA: Insufficient documentation

## 2021-05-29 DIAGNOSIS — I10 Essential (primary) hypertension: Secondary | ICD-10-CM | POA: Diagnosis not present

## 2021-05-29 DIAGNOSIS — J069 Acute upper respiratory infection, unspecified: Secondary | ICD-10-CM | POA: Diagnosis not present

## 2021-05-29 DIAGNOSIS — R197 Diarrhea, unspecified: Secondary | ICD-10-CM | POA: Insufficient documentation

## 2021-05-29 DIAGNOSIS — Z79899 Other long term (current) drug therapy: Secondary | ICD-10-CM | POA: Insufficient documentation

## 2021-05-29 DIAGNOSIS — R059 Cough, unspecified: Secondary | ICD-10-CM | POA: Diagnosis present

## 2021-05-29 LAB — RESP PANEL BY RT-PCR (FLU A&B, COVID) ARPGX2
Influenza A by PCR: NEGATIVE
Influenza B by PCR: NEGATIVE
SARS Coronavirus 2 by RT PCR: NEGATIVE

## 2021-05-29 MED ORDER — DEXAMETHASONE SODIUM PHOSPHATE 10 MG/ML IJ SOLN
10.0000 mg | Freq: Once | INTRAMUSCULAR | Status: AC
  Administered 2021-05-29: 10 mg via INTRAMUSCULAR
  Filled 2021-05-29: qty 1

## 2021-05-29 NOTE — ED Notes (Signed)
Unable to obtain e-sign d/t equipment malfunction.  D/c instructions, follow up, medications and return precautions d/w pt and pt's mom.  Both verbalized understanding of the above.  Pt ambulatory from department.

## 2021-05-29 NOTE — ED Triage Notes (Signed)
Pt reports with cough, chills, and generalized body aches x 1 week. Pt states that he was seen previously but feels that his symptoms are getting worse.

## 2021-05-29 NOTE — ED Provider Notes (Signed)
[facility]-EMERGENCY DEPT Provider Note   CSN: [id number] Arrival date & time: 05/29/21  0450     History Chief Complaint  Patient presents with   Cough   Chills   Generalized Body Aches    Bobby Jones is a 40 y.o. male with past medical history significant for hypertension, hyperlipidemia who reports with 1 week of cough, chills, body aches, congestion, sore throat.  Patient reports minimal nausea, no vomiting.  Patient does endorse some diarrhea, however he attributes it to some of his medication that he takes at baseline because diarrhea was present prior to these new symptoms.  Patient was seen at urgent care a few days ago, diagnosed with upper respiratory infection of unknown etiology, with negative flu test, COVID test, strep swab.  Patient has been taking Mucinex, and resting, but feels that his cough, body aches are worsening.  Patient denies fever at this time.  Patient does report that coughing now causing a feeling of shortness of breath.  Patient denies chest pain.   Cough Associated symptoms: myalgias and shortness of breath       Past Medical History:  Diagnosis Date   Hyperlipidemia    Hypertension     There are no problems to display for this patient.   Past Surgical History:  Procedure Laterality Date   CLOSED REDUCTION PATELLAR Left 07/05/2020   Procedure: ATTEMPTED CLOSED REDUCTION PATELLA;  Surgeon: Yolonda Kida, MD;  Location: [facility] OR;  Service: Orthopedics;  Laterality: Left;   ORIF PATELLA Left 07/05/2020   Procedure: OPEN REDUCTION OF LATERAL PATELLA DISLOCATION;  Surgeon: Yolonda Kida, MD;  Location: [facility] OR;  Service: Orthopedics;  Laterality: Left;       Family History  Problem Relation Age of Onset   Diabetes Father    Hypertension Other    Diabetes Other    Coronary artery disease Other     Social History   Tobacco Use   Smoking status: Never   Smokeless tobacco: Never  Vaping Use   Vaping Use:  Never used  Substance Use Topics   Alcohol use: No   Drug use: No    Home Medications Prior to Admission medications   Medication Sig Start Date End Date Taking? Authorizing Provider  amLODipine (NORVASC) 5 MG tablet Take 1 tablet (5 mg total) by mouth daily. 02/14/21 03/16/21  Farrel Gordon, PA-C  cyclobenzaprine (FLEXERIL) 10 MG tablet Take 1 tablet (10 mg total) by mouth 2 (two) times daily as needed for muscle spasms. 06/09/18   Fawze, Mina A, PA-C  escitalopram (LEXAPRO) 10 MG tablet Take 1 tablet (10 mg total) by mouth daily. 11/28/20   Estella Husk, MD  hydrochlorothiazide (HYDRODIURIL) 12.5 MG tablet Take 12.5 mg by mouth daily. 05/21/21   [provider]  ibuprofen (ADVIL,MOTRIN) 200 MG tablet Take 200 mg by mouth every 6 (six) hours as needed.    [provider]  methocarbamol (ROBAXIN) 500 MG tablet Take 1 tablet (500 mg total) by mouth every 6 (six) hours as needed for muscle spasms. 07/05/20   Yolonda Kida, MD  mupirocin ointment (BACTROBAN) 2 % Apply 1 application topically 2 (two) times daily. Apply to upper lip x 7 days twice daily 01/10/21   Cheryll Cockayne, MD  naproxen (NAPROSYN) 375 MG tablet Take 1 tablet (375 mg total) by mouth 2 (two) times daily. 10/29/16   McDonald, Mia A, PA-C  pravastatin (PRAVACHOL) 10 MG tablet Take 10 mg by mouth daily. 05/21/21  [provider]  propranolol (INDERAL) 40 MG tablet Take 40 mg by mouth 3 (three) times daily. 05/12/21   [provider]  valACYclovir (VALTREX) 1000 MG tablet Take 1 tablet (1,000 mg total) by mouth 3 (three) times daily. 01/10/21   Cheryll Cockayne, MD    Allergies    Patient has no known allergies.  Review of Systems   Review of Systems  HENT:  Positive for congestion.   Respiratory:  Positive for cough and shortness of breath.   Musculoskeletal:  Positive for myalgias.  All other systems reviewed and are negative.  Physical Exam Updated Vital Signs BP (!) 167/114    Pulse 77   Temp 98.8 F (37.1 C) (Oral)   Resp 18   Ht 6\' 2"  (1.88 m)   Wt 125.6 kg   SpO2 96%   BMI 35.56 kg/m   Physical Exam Vitals and nursing note reviewed.  Constitutional:      General: He is not in acute distress.    Appearance: Normal appearance.  HENT:     Head: Normocephalic and atraumatic.  Eyes:     General:        Right eye: No discharge.        Left eye: No discharge.  Cardiovascular:     Rate and Rhythm: Normal rate and regular rhythm.     Heart sounds: No murmur heard.   No friction rub. No gallop.  Pulmonary:     Effort: Pulmonary effort is normal.     Breath sounds: Normal breath sounds.     Comments: Minimal scattered rhonchi that clear with cough, otherwise good respiratory effort, clear breath sounds throughout. Abdominal:     General: Bowel sounds are normal.     Palpations: Abdomen is soft.  Skin:    General: Skin is warm and dry.     Capillary Refill: Capillary refill takes less than 2 seconds.  Neurological:     Mental Status: He is alert and oriented to person, place, and time.  Psychiatric:        Mood and Affect: Mood normal.        Behavior: Behavior normal.    ED Results / Procedures / Treatments   Labs (all labs ordered are listed, but only abnormal results are displayed) Labs Reviewed  RESP PANEL BY RT-PCR (FLU A&B, COVID) [id number]    EKG None  Radiology DG Chest 2 View  Result Date: 05/29/2021 CLINICAL DATA:  40 year old male with shortness of breath cough and congestion. EXAM: CHEST - 2 VIEW COMPARISON:  Chest radiographs 07/05/2020 and earlier. FINDINGS: Lung volumes and mediastinal contours are stable, within normal limits. Visualized tracheal air column is within normal limits. Lung markings appear stable since 2011. No pneumothorax, pleural effusion or acute pulmonary opacity. No acute osseous abnormality identified. Negative visible bowel gas. IMPRESSION: Negative.  No cardiopulmonary abnormality. Electronically Signed    By: 2012 M.D.   On: 05/29/2021 07:17    Procedures Procedures   Medications Ordered in ED Medications  dexamethasone (DECADRON) injection 10 mg (has no administration in time range)    ED Course  I have reviewed the triage vital signs and the nursing notes.  Pertinent labs & imaging results that were available during my care of the patient were reviewed by me and considered in my medical decision making (see chart for details).    MDM Rules/Calculators/A&P  Overall well-appearing male with 7 days of cough, congestion, now progressing to shortness of breath. Patient reports that he is managing somewhat with over-the-counter medications, but just feels that he has not improved enough.  Given duration of course suspicious for acute bronchitis at this time.  Differential diagnosis includes ongoing upper respiratory infection without lower respiratory involvement, pneumonia, reactive airway disease.  Will obtain chest x-ray to evaluate for new pneumonia at this time, do not believe that blood work is necessary as patient appears well, no significant new symptoms, stable vital signs at this time.  Chest x-ray shows no new infiltrate.  We will treat with steroids, continued decongestant, encourage rest, fluids, tylenol as needed for fever/chills. Final Clinical Impression(s) / ED Diagnoses Final diagnoses:  Viral upper respiratory tract infection  Bronchitis    Rx / DC Orders ED Discharge Orders     None        Olene Floss, PA-C 05/29/21 0737    Dione Booze, MD 05/29/21 [phone]

## 2021-05-29 NOTE — Discharge Instructions (Signed)
As we discussed please continue to rest, drink plenty of fluids, use decongestant, Tylenol as needed.  The steroid injection that I gave you today may make you feel some insomnia, increased energy, increased appetite for a day or 2.

## 2021-05-31 ENCOUNTER — Encounter (HOSPITAL_COMMUNITY): Payer: Self-pay

## 2021-05-31 ENCOUNTER — Emergency Department (HOSPITAL_COMMUNITY)
Admission: EM | Admit: 2021-05-31 | Discharge: 2021-05-31 | Disposition: A | Discharge status: Home / Self Care | Payer: BC Managed Care – PPO | Attending: Emergency Medicine | Admitting: Emergency Medicine

## 2021-05-31 ENCOUNTER — Other Ambulatory Visit: Payer: Self-pay

## 2021-05-31 DIAGNOSIS — J40 Bronchitis, not specified as acute or chronic: Secondary | ICD-10-CM | POA: Insufficient documentation

## 2021-05-31 DIAGNOSIS — J069 Acute upper respiratory infection, unspecified: Secondary | ICD-10-CM | POA: Diagnosis not present

## 2021-05-31 DIAGNOSIS — J3489 Other specified disorders of nose and nasal sinuses: Secondary | ICD-10-CM | POA: Insufficient documentation

## 2021-05-31 DIAGNOSIS — I10 Essential (primary) hypertension: Secondary | ICD-10-CM | POA: Insufficient documentation

## 2021-05-31 DIAGNOSIS — R059 Cough, unspecified: Secondary | ICD-10-CM | POA: Diagnosis present

## 2021-05-31 DIAGNOSIS — Z79899 Other long term (current) drug therapy: Secondary | ICD-10-CM | POA: Diagnosis not present

## 2021-05-31 NOTE — ED Triage Notes (Signed)
Pt. Arrived POV c/o cough, sore throat, fatigue, and weakness x2 weeks. Was seen prior for same symptoms, but reports no relief.

## 2021-05-31 NOTE — ED Provider Notes (Addendum)
[facility] Provider Note   CSN: [id number] Arrival date & time: 05/31/21  1245     History Chief Complaint  Patient presents with   Cough    Bobby Jones is a 40 y.o. male who presents to ED for ongoing symptoms of URI with cough which was originally diagnosed on 11/10. Patient was tested for COVID, flu and strep all of which were negative on 11/10.  Patient states that his symptoms have been seeming to improve since he was last seen here on 11/15, however, he does not feel he is ready to return to work and would like a note writing him out of work until this Monday as requested by his HR department.  Patient was seen here on 11/15 and given 10 mg Decadron injection which patient states improved his symptoms. Patient states symptoms returned the following day but they were slightly improved. Patient endorses sore throat, productive cough, body aches, headache, chills, runny nose.  Patient denies fever, nausea, vomiting, diarrhea.    Cough Associated symptoms: chills, headaches, myalgias, rhinorrhea and sore throat   Associated symptoms: no fever       Past Medical History:  Diagnosis Date   Hyperlipidemia    Hypertension     There are no problems to display for this patient.   Past Surgical History:  Procedure Laterality Date   CLOSED REDUCTION PATELLAR Left 07/05/2020   Procedure: ATTEMPTED CLOSED REDUCTION PATELLA;  Surgeon: Yolonda Kida, MD;  Location: [facility] OR;  Service: Orthopedics;  Laterality: Left;   ORIF PATELLA Left 07/05/2020   Procedure: OPEN REDUCTION OF LATERAL PATELLA DISLOCATION;  Surgeon: Yolonda Kida, MD;  Location: [facility] OR;  Service: Orthopedics;  Laterality: Left;       Family History  Problem Relation Age of Onset   Diabetes Father    Hypertension Other    Diabetes Other    Coronary artery disease Other     Social History   Tobacco Use   Smoking status: Never   Smokeless tobacco: Never   Vaping Use   Vaping Use: Never used  Substance Use Topics   Alcohol use: No   Drug use: No    Home Medications Prior to Admission medications   Medication Sig Start Date End Date Taking? Authorizing Provider  amLODipine (NORVASC) 5 MG tablet Take 1 tablet (5 mg total) by mouth daily. 02/14/21 03/16/21  Farrel Gordon, PA-C  cyclobenzaprine (FLEXERIL) 10 MG tablet Take 1 tablet (10 mg total) by mouth 2 (two) times daily as needed for muscle spasms. 06/09/18   Fawze, Mina A, PA-C  escitalopram (LEXAPRO) 10 MG tablet Take 1 tablet (10 mg total) by mouth daily. 11/28/20   Estella Husk, MD  hydrochlorothiazide (HYDRODIURIL) 12.5 MG tablet Take 12.5 mg by mouth daily. 05/21/21   [provider]  ibuprofen (ADVIL,MOTRIN) 200 MG tablet Take 200 mg by mouth every 6 (six) hours as needed.    [provider]  methocarbamol (ROBAXIN) 500 MG tablet Take 1 tablet (500 mg total) by mouth every 6 (six) hours as needed for muscle spasms. 07/05/20   Yolonda Kida, MD  mupirocin ointment (BACTROBAN) 2 % Apply 1 application topically 2 (two) times daily. Apply to upper lip x 7 days twice daily 01/10/21   Cheryll Cockayne, MD  naproxen (NAPROSYN) 375 MG tablet Take 1 tablet (375 mg total) by mouth 2 (two) times daily. 10/29/16   McDonald, Mia A, PA-C  pravastatin (PRAVACHOL) 10 MG tablet Take  10 mg by mouth daily. 05/21/21   [provider]  propranolol (INDERAL) 40 MG tablet Take 40 mg by mouth 3 (three) times daily. 05/12/21   [provider]  valACYclovir (VALTREX) 1000 MG tablet Take 1 tablet (1,000 mg total) by mouth 3 (three) times daily. 01/10/21   Cheryll Cockayne, MD    Allergies    Patient has no known allergies.  Review of Systems   Review of Systems  Constitutional:  Positive for chills. Negative for fever.  HENT:  Positive for rhinorrhea and sore throat. Negative for congestion.   Respiratory:  Positive for cough.   Gastrointestinal:  Negative for  abdominal pain, diarrhea, nausea and vomiting.  Musculoskeletal:  Positive for myalgias.  Neurological:  Positive for headaches.  All other systems reviewed and are negative.  Physical Exam Updated Vital Signs BP (!) 138/106 (BP Location: Right Arm)   Pulse 87   Temp 99.4 F (37.4 C)   Resp 20   Ht 6\' 2"  (1.88 m)   Wt 125.6 kg   SpO2 96%   BMI 35.56 kg/m   Physical Exam Constitutional:      General: He is not in acute distress.    Appearance: He is not ill-appearing or toxic-appearing.  HENT:     Head: Normocephalic.     Nose: Nose normal.     Mouth/Throat:     Mouth: Mucous membranes are moist.  Eyes:     Extraocular Movements: Extraocular movements intact.     Pupils: Pupils are equal, round, and reactive to light.  Cardiovascular:     Rate and Rhythm: Normal rate and regular rhythm.  Pulmonary:     Effort: Pulmonary effort is normal.     Breath sounds: Normal breath sounds. No wheezing.  Abdominal:     General: Abdomen is flat.     Palpations: Abdomen is soft.     Tenderness: There is no abdominal tenderness.  Musculoskeletal:        General: Normal range of motion.     Cervical back: Normal range of motion.  Skin:    General: Skin is warm and dry.     Capillary Refill: Capillary refill takes less than 2 seconds.  Neurological:     General: No focal deficit present.     Mental Status: He is alert.    ED Results / Procedures / Treatments   Labs (all labs ordered are listed, but only abnormal results are displayed) Labs Reviewed - No data to display  EKG None  Radiology No results found.  Procedures Procedures   Medications Ordered in ED Medications - No data to display  ED Course  I have reviewed the triage vital signs and the nursing notes.  Pertinent labs & imaging results that were available during my care of the patient were reviewed by me and considered in my medical decision making (see chart for details).    MDM Rules/Calculators/A&P                           40 year old male presents for ongoing symptoms from reported viral upper respiratory infection (diagnosed 11/10) and bronchitis.  Patient was seen here on 11/15 and given 10 mg Decadron injection which she states improved his symptoms, and also received chest x-ray. Patient states he still does not feel well enough to return to work and his main request/reason for visit today is to receive a work note which will write him out  of work until 11/21 which has been requested by his HR department. On exam, patient is well-appearing, afebrile, handling secretions well, nonhypoxic, with clear breath sounds bilaterally.  On review of patient's chart, he had a chest x-ray done on 11/15 which showed no evidence of consolidation.  Patient denies any fever, nausea, vomiting, diarrhea, shortness of breath.  Due to recent clear chest x-ray 2 days prior, no indication to repeat chest x-ray today as patient is not complaining of any new symptoms.  Patient was tested for COVID, flu, strep throat on 11/10 at urgent care office, all of which resulted as negative.  Due to recent negative results, I do not feel there is a need to retest or reswab this patient for COVID, flu.   Patient given strict return precautions. Patient agreeable to plan. Patient stable on discharge.  Final Clinical Impression(s) / ED Diagnoses Final diagnoses:  Bronchitis  Viral upper respiratory infection    Rx / DC Orders ED Discharge Orders     None        Al Decant, PA 05/31/21 1440    Al Decant, [place] 05/31/21 1510    Jacalyn Lefevre, MD 06/01/21 [phone]

## 2021-05-31 NOTE — Discharge Instructions (Addendum)
Return to ED with any new or worsening symptoms such as shortness of breath, coughing up blood. Continue to treat body aches and fevers with alternating ibuprofen and tylenol Follow up with PCP in 5-7 if symptoms persist

## 2021-06-04 ENCOUNTER — Encounter (HOSPITAL_COMMUNITY): Payer: Self-pay

## 2021-06-04 ENCOUNTER — Emergency Department (HOSPITAL_COMMUNITY)
Admission: EM | Admit: 2021-06-04 | Discharge: 2021-06-04 | Disposition: A | Discharge status: Home / Self Care | Payer: BC Managed Care – PPO | Attending: Emergency Medicine | Admitting: Emergency Medicine

## 2021-06-04 ENCOUNTER — Other Ambulatory Visit: Payer: Self-pay

## 2021-06-04 DIAGNOSIS — Z79899 Other long term (current) drug therapy: Secondary | ICD-10-CM | POA: Insufficient documentation

## 2021-06-04 DIAGNOSIS — J029 Acute pharyngitis, unspecified: Secondary | ICD-10-CM

## 2021-06-04 DIAGNOSIS — J020 Streptococcal pharyngitis: Secondary | ICD-10-CM | POA: Diagnosis not present

## 2021-06-04 DIAGNOSIS — I10 Essential (primary) hypertension: Secondary | ICD-10-CM | POA: Insufficient documentation

## 2021-06-04 MED ORDER — AMOXICILLIN 500 MG PO CAPS: 500.0000 mg | ORAL_CAPSULE | Freq: Two times a day (BID) | ORAL | 0 refills | Status: AC

## 2021-06-04 NOTE — ED Provider Notes (Signed)
[facility] DEPT Provider Note   CSN: [id number] Arrival date & time: 06/04/21  0757     History Chief Complaint  Patient presents with   Sore Throat    Bobby Jones is a 40 y.o. male.  Patient with history of hypertension presents today with complaint of sore throat.  He states that same has been ongoing for 2 weeks.  Symptoms initially began with upper respiratory infection including cough, congestion, and rhinorrhea.  The symptoms have since resolved, however his sore throat is only worsened.  He has been seen several times for same with multiple negative flu and COVID test and a negative strep swab as well.  Strep culture did grow rare Streptococcus not group A.  Patient denies any difficulty swallowing or voice changes.  Endorses low-grade subjective fevers which she has been managing with Tylenol and ibuprofen.  He endorses trying several over-the-counter medications without relief including Cepacol and throat spray.  The history is provided by the patient. No language interpreter was used.  Sore Throat Pertinent negatives include no chest pain, no abdominal pain, no headaches and no shortness of breath.      Past Medical History:  Diagnosis Date   Hyperlipidemia    Hypertension     There are no problems to display for this patient.   Past Surgical History:  Procedure Laterality Date   CLOSED REDUCTION PATELLAR Left 07/05/2020   Procedure: ATTEMPTED CLOSED REDUCTION PATELLA;  Surgeon: Nicholes Stairs, MD;  Location: [place];  Service: Orthopedics;  Laterality: Left;   ORIF PATELLA Left 07/05/2020   Procedure: OPEN REDUCTION OF LATERAL PATELLA DISLOCATION;  Surgeon: Nicholes Stairs, MD;  Location: [place];  Service: Orthopedics;  Laterality: Left;       Family History  Problem Relation Age of Onset   Diabetes Father    Hypertension Other    Diabetes Other    Coronary artery disease Other     Social History   Tobacco Use    Smoking status: Never   Smokeless tobacco: Never  Vaping Use   Vaping Use: Never used  Substance Use Topics   Alcohol use: No   Drug use: No    Home Medications Prior to Admission medications   Medication Sig Start Date End Date Taking? Authorizing Provider  amoxicillin (AMOXIL) 500 MG capsule Take 1 capsule (500 mg total) by mouth 2 (two) times daily for 10 days. 06/04/21 06/14/21 Yes Clance Baquero A, PA-C  amLODipine (NORVASC) 5 MG tablet Take 1 tablet (5 mg total) by mouth daily. 02/14/21 03/16/21  Alfredia Client, PA-C  cyclobenzaprine (FLEXERIL) 10 MG tablet Take 1 tablet (10 mg total) by mouth 2 (two) times daily as needed for muscle spasms. 06/09/18   Fawze, Mina A, PA-C  escitalopram (LEXAPRO) 10 MG tablet Take 1 tablet (10 mg total) by mouth daily. 11/28/20   Ival Bible, MD  hydrochlorothiazide (HYDRODIURIL) 12.5 MG tablet Take 12.5 mg by mouth daily. 05/21/21   [provider]  ibuprofen (ADVIL,MOTRIN) 200 MG tablet Take 200 mg by mouth every 6 (six) hours as needed.    [provider]  methocarbamol (ROBAXIN) 500 MG tablet Take 1 tablet (500 mg total) by mouth every 6 (six) hours as needed for muscle spasms. 07/05/20   Nicholes Stairs, MD  mupirocin ointment (BACTROBAN) 2 % Apply 1 application topically 2 (two) times daily. Apply to upper lip x 7 days twice daily 01/10/21   Luna Fuse, MD  naproxen (NAPROSYN)  375 MG tablet Take 1 tablet (375 mg total) by mouth 2 (two) times daily. 10/29/16   McDonald, Mia A, PA-C  pravastatin (PRAVACHOL) 10 MG tablet Take 10 mg by mouth daily. 05/21/21   [provider]  propranolol (INDERAL) 40 MG tablet Take 40 mg by mouth 3 (three) times daily. 05/12/21   [provider]  valACYclovir (VALTREX) 1000 MG tablet Take 1 tablet (1,000 mg total) by mouth 3 (three) times daily. 01/10/21   Cheryll Cockayne, MD    Allergies    Patient has no known allergies.  Review of Systems   Review of Systems   Constitutional:  Positive for fever. Negative for appetite change, chills and fatigue.  HENT:  Positive for congestion, postnasal drip, rhinorrhea and sore throat. Negative for trouble swallowing and voice change.   Respiratory:  Negative for cough, shortness of breath, wheezing and stridor.   Cardiovascular:  Negative for chest pain.  Gastrointestinal:  Negative for abdominal pain, diarrhea, nausea and vomiting.  Musculoskeletal:  Negative for neck pain and neck stiffness.  Skin:  Negative for rash.  Neurological:  Negative for dizziness, tremors, seizures, syncope, facial asymmetry, speech difficulty, weakness, light-headedness, numbness and headaches.  Psychiatric/Behavioral:  Negative for confusion and decreased concentration.   All other systems reviewed and are negative.  Physical Exam Updated Vital Signs BP (!) 152/106   Pulse 70   Temp 98.2 F (36.8 C) (Oral)   Resp 18   Ht 6\' 2"  (1.88 m)   Wt 125.6 kg   SpO2 97%   BMI 35.56 kg/m   Physical Exam Vitals and nursing note reviewed.  Constitutional:      Appearance: He is well-developed. He is obese.     Comments: Patient sitting comfortably in chair no acute distress  HENT:     Head: Normocephalic and atraumatic.     Mouth/Throat:     Mouth: Mucous membranes are moist. No oral lesions.     Pharynx: Uvula midline. Posterior oropharyngeal erythema present. No pharyngeal swelling, oropharyngeal exudate or uvula swelling.     Tonsils: Tonsillar exudate present. No tonsillar abscesses. 3+ on the right. 3+ on the left.  Eyes:     Conjunctiva/sclera: Conjunctivae normal.     Pupils: Pupils are equal, round, and reactive to light.  Cardiovascular:     Rate and Rhythm: Normal rate and regular rhythm.     Heart sounds: Normal heart sounds.  Pulmonary:     Effort: Pulmonary effort is normal. No respiratory distress.     Breath sounds: Normal breath sounds. No stridor. No wheezing, rhonchi or rales.  Abdominal:     Palpations:  Abdomen is soft.  Musculoskeletal:     Cervical back: Normal range of motion and neck supple.  Lymphadenopathy:     Cervical: Cervical adenopathy present.  Skin:    General: Skin is warm and dry.  Neurological:     General: No focal deficit present.     Mental Status: He is alert.  Psychiatric:        Mood and Affect: Mood normal.        Behavior: Behavior normal.    ED Results / Procedures / Treatments   Labs (all labs ordered are listed, but only abnormal results are displayed) Labs Reviewed - No data to display  EKG None  Radiology No results found.  Procedures Procedures   Medications Ordered in ED Medications - No data to display  ED Course  I have reviewed the triage vital  signs and the nursing notes.  Pertinent labs & imaging results that were available during my care of the patient were reviewed by me and considered in my medical decision making (see chart for details).    MDM Rules/Calculators/A&P                         Patient presents today with 2 weeks of sore throat.  He has tried multiple over-the-counter interventions without success.  He has also been seen several times for same with negative COVID flu and strep swabs.  However, strep culture did grow Streptococcus not group A strep.  Patient with fever, tonsillar exudate, absent cough and anterior cervical lymphadenopathy in the presence of strep culture with bacterial growth.  Therefore will treat with antibiotics, diagnosis of bacterial pharyngitis. Pt appears mildly dehydrated, discussed importance of water rehydration. Presentation non concerning for PTA or RPA. No trismus or uvula deviation. Specific return precautions discussed. Pt able to drink water in ED without difficulty with intact air way. Recommended PCP follow up.   Findings and plan of care discussed with supervising physician Dr. Dina Rich who is in agreement.    Final Clinical Impression(s) / ED Diagnoses Final diagnoses:  Sore throat   Strep throat    Rx / DC Orders ED Discharge Orders          Ordered    amoxicillin (AMOXIL) 500 MG capsule  2 times daily        06/04/21 1402          An After Visit Summary was printed and given to the patient.    Bud Face, PA-C 06/04/21 1622    Lorelle Gibbs, DO 06/07/21 2257

## 2021-06-04 NOTE — ED Triage Notes (Signed)
Patient states he has been sick for the past 2 weeks and has been seen for the same. Patient states he tried to go back to work today, but was still having a sore throat and fatigue. Patient states his work sent him to get a work note for today and Thursday.

## 2021-06-04 NOTE — Discharge Instructions (Signed)
You were seen today for evaluation of your sore throat. As we discussed, your culture grew Streptococcus, however not the, group a strep throat is typically seen and associated with complicating risk factors.  However, given your duration of symptoms and positive bacterial growth, I feel that treatment with antibiotics is still warranted.  Therefore I have put in a prescription for amoxicillin, please take this as prescribed its entirety.  Usually, you may try Cepacol for pain relief over-the-counter as well as Tylenol and ibuprofen for fever management.  Follow-up with your primary care for continued symptom management.  Return if development of new or worsening symptoms.

## 2021-09-05 ENCOUNTER — Emergency Department (HOSPITAL_COMMUNITY)
Admission: EM | Admit: 2021-09-05 | Discharge: 2021-09-05 | Disposition: A | Discharge status: Home / Self Care | Payer: BC Managed Care – PPO | Attending: Emergency Medicine | Admitting: Emergency Medicine

## 2021-09-05 DIAGNOSIS — B9789 Other viral agents as the cause of diseases classified elsewhere: Secondary | ICD-10-CM | POA: Diagnosis not present

## 2021-09-05 DIAGNOSIS — J028 Acute pharyngitis due to other specified organisms: Secondary | ICD-10-CM | POA: Diagnosis not present

## 2021-09-05 DIAGNOSIS — Z20822 Contact with and (suspected) exposure to covid-19: Secondary | ICD-10-CM | POA: Insufficient documentation

## 2021-09-05 DIAGNOSIS — J029 Acute pharyngitis, unspecified: Secondary | ICD-10-CM | POA: Diagnosis present

## 2021-09-05 LAB — RESP PANEL BY RT-PCR (FLU A&B, COVID) ARPGX2
Influenza A by PCR: NEGATIVE
Influenza B by PCR: NEGATIVE
SARS Coronavirus 2 by RT PCR: NEGATIVE

## 2021-09-05 LAB — GROUP A STREP BY PCR: Group A Strep by PCR: NOT DETECTED

## 2021-09-05 NOTE — ED Triage Notes (Signed)
Pt states he has had a sore throat for 4 days. States over the counter medicine has not helped. Denies fever.

## 2021-09-05 NOTE — Discharge Instructions (Signed)
You were evaluated in the Emergency Department and after careful evaluation, we did not find any emergent condition requiring admission or further testing in the hospital.  Your exam/testing today was overall reassuring.  You do not have evidence of peritonsillar abscess which would cause one-sided swelling in your throat.  He you have no fever.  You are tolerating oral intake.  Your COVID-19 and influenza PCR testing was negative.  Your strep PCR testing was also negative.  Please return to the Emergency Department if you experience any worsening of your condition.  Thank you for allowing [place] to be a part of your care.

## 2021-09-05 NOTE — ED Notes (Signed)
I provided reinforced discharge education based off of discharge instructions. Pt acknowledged and understood my education. Pt had no further questions/concerns for provider/myself.  °

## 2021-09-05 NOTE — ED Provider Notes (Signed)
[facility]-EMERGENCY DEPT Provider Note   CSN: [id number] Arrival date & time: 09/05/21  0272     History  Chief Complaint  Patient presents with   Sore Throat    Bobby Jones is a 41 y.o. male.   Sore Throat   41 year old male presenting to the emerged department with a sore throat for the past 4 days.  He denies any other symptoms.  No chest pain or shortness of breath.  No fevers or chills.  No difficulty phonating or swallowing.  Home Medications Prior to Admission medications   Medication Sig Start Date End Date Taking? Authorizing Provider  hydrochlorothiazide (HYDRODIURIL) 12.5 MG tablet Take 12.5 mg by mouth daily. 05/21/21   [provider]  pravastatin (PRAVACHOL) 10 MG tablet Take 10 mg by mouth daily. 05/21/21   [provider]  propranolol (INDERAL) 40 MG tablet Take 40 mg by mouth 3 (three) times daily. 05/12/21   [provider]      Allergies    Patient has no known allergies.    Review of Systems   Review of Systems  All other systems reviewed and are negative.  Physical Exam Updated Vital Signs BP 132/81 (BP Location: Left Arm)    Pulse 78    Temp 98 F (36.7 C) (Oral)    Resp 18    Ht 6\' 2"  (1.88 m)    Wt 124.7 kg    SpO2 100%    BMI 35.31 kg/m  Physical Exam Vitals and nursing note reviewed.  Constitutional:      General: He is not in acute distress. HENT:     Head: Normocephalic and atraumatic.     Mouth/Throat:     Mouth: Mucous membranes are moist. No oral lesions.     Pharynx: No oropharyngeal exudate or posterior oropharyngeal erythema.     Tonsils: No tonsillar exudate or tonsillar abscesses. 0 on the right. 0 on the left.  Eyes:     Conjunctiva/sclera: Conjunctivae normal.     Pupils: Pupils are equal, round, and reactive to light.  Cardiovascular:     Rate and Rhythm: Normal rate and regular rhythm.  Pulmonary:     Effort: Pulmonary effort is normal. No respiratory distress.   Abdominal:     General: There is no distension.     Tenderness: There is no guarding.  Musculoskeletal:        General: No deformity or signs of injury.     Cervical back: Full passive range of motion without pain and neck supple.  Skin:    Findings: No lesion or rash.  Neurological:     General: No focal deficit present.     Mental Status: He is alert. Mental status is at baseline.    ED Results / Procedures / Treatments   Labs (all labs ordered are listed, but only abnormal results are displayed) Labs Reviewed  RESP PANEL BY RT-PCR (FLU A&B, COVID) [id number]  GROUP A STREP BY PCR    EKG None  Radiology No results found.  Procedures Procedures    Medications Ordered in ED Medications - No data to display  ED Course/ Medical Decision Making/ A&P                           Medical Decision Making  41 year old male presenting to the emerged department with a sore throat for the past 4 days.  He denies any other symptoms.  No chest  pain or shortness of breath.  No fevers or chills.  No difficulty phonating or swallowing.  On arrival, the patient was afebrile, hemodynamically stable, tolerating oral intake.  No evidence for PTA, RPA, Ludwig's angina.  Patient is tolerating oral intake.  Presents with a sore throat for the past 4 days.  Good range of motion of the neck.  No trismus.  COVID-19 and influenza PCR testing in addition to strep PCR testing was collected and resulted negative.  Favor likely viral pharyngitis.   On my exam, the patient is well-appearing and well-hydrated.  The patient's lungs are clear to auscultation bilaterally. Additionally, the patient has a soft/non-tender abdomen, clear tympanic membranes, and no oropharyngeal exudates.  There are no signs of meningismus.  I see no signs of an acute bacterial infection.  I discussed symptomatic management, including hydration, motrin, and tylenol. The patient felt safe being discharged from the ED.  They agreed  to followup with the PCP if needed.  I provided ED return precautions.    Final Clinical Impression(s) / ED Diagnoses Final diagnoses:  Sore throat  Viral pharyngitis    Rx / DC Orders ED Discharge Orders     None         Ernie Avena, MD 09/05/21 1645

## 2021-09-24 ENCOUNTER — Other Ambulatory Visit: Payer: Self-pay

## 2021-09-24 ENCOUNTER — Ambulatory Visit
Admission: EM | Admit: 2021-09-24 | Discharge: 2021-09-24 | Disposition: A | Discharge status: Home / Self Care | Payer: BC Managed Care – PPO | Attending: Physician Assistant | Admitting: Physician Assistant

## 2021-09-24 DIAGNOSIS — B349 Viral infection, unspecified: Secondary | ICD-10-CM

## 2021-09-24 LAB — POCT INFLUENZA A/B
Influenza A, POC: NEGATIVE
Influenza B, POC: NEGATIVE

## 2021-09-24 MED ORDER — ONDANSETRON 4 MG PO TBDP: 4.0000 mg | ORAL_TABLET | Freq: Three times a day (TID) | ORAL | 0 refills | Status: DC | PRN

## 2021-09-24 NOTE — ED Triage Notes (Signed)
2 day h/o cough, congestion, HA, chills, night sweats, emesis and runny nose. ?Denies sore throat and diarrhea. Pt has been unable to take meds noting that he vomits when he tries to swallow. Notes decreased appetite.  ?

## 2021-09-24 NOTE — ED Provider Notes (Signed)
?[facility] ? ? ? ?CSN: [id number] ?Arrival date & time: 09/24/21  0856 ? ? ?  ? ?History   ?Chief Complaint ?Chief Complaint  ?Patient presents with  ? Cough  ? ? ?HPI ?Bobby Jones is a 41 y.o. male.  ? ?Patient here today for evaluation of 2-day history of cough, congestion, headache, chills, vomiting and runny nose.  He has not had any sore throat or diarrhea.  He states that he has not been able to hold any medication down. ? ?The history is provided by the patient.  ?Cough ?Associated symptoms: chills, diaphoresis, headaches and sore throat   ?Associated symptoms: no ear pain, no eye discharge, no fever and no shortness of breath   ? ?Past Medical History:  ?Diagnosis Date  ? Hyperlipidemia   ? Hypertension   ? ? ?There are no problems to display for this patient. ? ? ?Past Surgical History:  ?Procedure Laterality Date  ? CLOSED REDUCTION PATELLAR Left 07/05/2020  ? Procedure: ATTEMPTED CLOSED REDUCTION PATELLA;  Surgeon: Yolonda Kida, MD;  Location: [facility] OR;  Service: Orthopedics;  Laterality: Left;  ? ORIF PATELLA Left 07/05/2020  ? Procedure: OPEN REDUCTION OF LATERAL PATELLA DISLOCATION;  Surgeon: Yolonda Kida, MD;  Location: [facility] OR;  Service: Orthopedics;  Laterality: Left;  ? ? ? ? ? ?Home Medications   ? ?Prior to Admission medications   ?Medication Sig Start Date End Date Taking? Authorizing Provider  ?ondansetron (ZOFRAN-ODT) 4 MG disintegrating tablet Take 1 tablet (4 mg total) by mouth every 8 (eight) hours as needed. 09/24/21  Yes Tomi Bamberger, PA-C  ?hydrochlorothiazide (HYDRODIURIL) 12.5 MG tablet Take 12.5 mg by mouth daily. 05/21/21   [provider]  ?pravastatin (PRAVACHOL) 10 MG tablet Take 10 mg by mouth daily. 05/21/21   [provider]  ?propranolol (INDERAL) 40 MG tablet Take 40 mg by mouth 3 (three) times daily. 05/12/21   [provider]  ? ? ?Family History ?Family History  ?Problem Relation Age of Onset  ? Diabetes Father   ?  Hypertension Other   ? Diabetes Other   ? Coronary artery disease Other   ? ? ?Social History ?Social History  ? ?Tobacco Use  ? Smoking status: Never  ? Smokeless tobacco: Never  ?Vaping Use  ? Vaping Use: Never used  ?Substance Use Topics  ? Alcohol use: No  ? Drug use: No  ? ? ? ?Allergies   ?Patient has no known allergies. ? ? ?Review of Systems ?Review of Systems  ?Constitutional:  Positive for chills and diaphoresis. Negative for fever.  ?HENT:  Positive for congestion and sore throat. Negative for ear pain.   ?Eyes:  Negative for discharge and redness.  ?Respiratory:  Positive for cough. Negative for shortness of breath.   ?Gastrointestinal:  Positive for nausea and vomiting. Negative for abdominal pain and diarrhea.  ?Neurological:  Positive for headaches.  ? ? ?Physical Exam ?Triage Vital Signs ?ED Triage Vitals  ?Enc Vitals Group  ?   BP 09/24/21 1016 113/85  ?   Pulse Rate 09/24/21 1016 80  ?   Resp 09/24/21 1016 17  ?   Temp 09/24/21 1016 99.1 ?F (37.3 ?C)  ?   Temp Source 09/24/21 1016 Oral  ?   SpO2 09/24/21 1016 94 %  ?   Weight --   ?   Height --   ?   Head Circumference --   ?   Peak Flow --   ?  Pain Score 09/24/21 1018 7  ?   Pain Loc --   ?   Pain Edu? --   ?   Excl. in GC? --   ? ?No data found. ? ?Updated Vital Signs ?BP 113/85 (BP Location: Left Arm)   Pulse 80   Temp 99.1 ?F (37.3 ?C) (Oral)   Resp 17   SpO2 94%  ?   ? ?Physical Exam ?Vitals and nursing note reviewed.  ?Constitutional:   ?   General: He is not in acute distress. ?   Appearance: Normal appearance. He is not ill-appearing.  ?HENT:  ?   Head: Normocephalic and atraumatic.  ?   Nose: Congestion present.  ?Eyes:  ?   Conjunctiva/sclera: Conjunctivae normal.  ?Cardiovascular:  ?   Rate and Rhythm: Normal rate and regular rhythm.  ?   Heart sounds: Normal heart sounds. No murmur heard. ?Pulmonary:  ?   Effort: Pulmonary effort is normal. No respiratory distress.  ?   Breath sounds: Normal breath sounds. No wheezing, rhonchi or  rales.  ?Skin: ?   General: Skin is warm and dry.  ?Neurological:  ?   Mental Status: He is alert.  ?Psychiatric:     ?   Mood and Affect: Mood normal.     ?   Thought Content: Thought content normal.  ? ? ? ?UC Treatments / Results  ?Labs ?(all labs ordered are listed, but only abnormal results are displayed) ?Labs Reviewed  ?POCT INFLUENZA A/B  ? ? ?EKG ? ? ?Radiology ?No results found. ? ?Procedures ?Procedures (including critical care time) ? ?Medications Ordered in UC ?Medications - No data to display ? ?Initial Impression / Assessment and Plan / UC Course  ?I have reviewed the triage vital signs and the nursing notes. ? ?Pertinent labs & imaging results that were available during my care of the patient were reviewed by me and considered in my medical decision making (see chart for details). ? ?  ?Suspect viral etiology of symptoms. Will treat with zofran. Recommend follow up with any further concerns.  ? ?Final Clinical Impressions(s) / UC Diagnoses  ? ?Final diagnoses:  ?Viral illness  ? ?Discharge Instructions   ?None ?  ? ?ED Prescriptions   ? ? Medication Sig Dispense Auth. Provider  ? ondansetron (ZOFRAN-ODT) 4 MG disintegrating tablet Take 1 tablet (4 mg total) by mouth every 8 (eight) hours as needed. 20 tablet Tomi Bamberger, PA-C  ? ?  ? ?PDMP not reviewed this encounter. ?  ?Tomi Bamberger, PA-C ?09/24/21 1249 ? ?

## 2022-02-28 ENCOUNTER — Emergency Department (HOSPITAL_COMMUNITY): Payer: No Typology Code available for payment source

## 2022-02-28 ENCOUNTER — Emergency Department (HOSPITAL_COMMUNITY)
Admission: EM | Admit: 2022-02-28 | Discharge: 2022-02-28 | Disposition: A | Discharge status: Home / Self Care | Payer: No Typology Code available for payment source | Attending: Emergency Medicine | Admitting: Emergency Medicine

## 2022-02-28 ENCOUNTER — Other Ambulatory Visit: Payer: Self-pay

## 2022-02-28 ENCOUNTER — Encounter (HOSPITAL_COMMUNITY): Payer: Self-pay

## 2022-02-28 DIAGNOSIS — R202 Paresthesia of skin: Secondary | ICD-10-CM | POA: Diagnosis present

## 2022-02-28 DIAGNOSIS — R944 Abnormal results of kidney function studies: Secondary | ICD-10-CM | POA: Insufficient documentation

## 2022-02-28 DIAGNOSIS — I10 Essential (primary) hypertension: Secondary | ICD-10-CM | POA: Insufficient documentation

## 2022-02-28 DIAGNOSIS — R7401 Elevation of levels of liver transaminase levels: Secondary | ICD-10-CM | POA: Insufficient documentation

## 2022-02-28 DIAGNOSIS — M542 Cervicalgia: Secondary | ICD-10-CM | POA: Diagnosis not present

## 2022-02-28 LAB — TROPONIN I (HIGH SENSITIVITY)
Troponin I (High Sensitivity): 4 ng/L (ref <18)
Troponin I (High Sensitivity): 5 ng/L (ref <18)

## 2022-02-28 LAB — CBC WITH DIFFERENTIAL/PLATELET
Abs Immature Granulocytes: 0.02 10*3/uL (ref 0.00–0.07)
Basophils Absolute: 0 10*3/uL (ref 0.0–0.1)
Basophils Relative: 1 %
Eosinophils Absolute: 0.2 10*3/uL (ref 0.0–0.5)
Eosinophils Relative: 3 %
HCT: 49.3 % (ref 39.0–52.0)
Hemoglobin: 16.4 g/dL (ref 13.0–17.0)
Immature Granulocytes: 0 %
Lymphocytes Relative: 42 %
Lymphs Abs: 2.9 10*3/uL (ref 0.7–4.0)
MCH: 31.1 pg (ref 26.0–34.0)
MCHC: 33.3 g/dL (ref 30.0–36.0)
MCV: 93.4 fL (ref 80.0–100.0)
Monocytes Absolute: 0.6 10*3/uL (ref 0.1–1.0)
Monocytes Relative: 9 %
Neutro Abs: 3.1 10*3/uL (ref 1.7–7.7)
Neutrophils Relative %: 45 %
Platelets: 262 10*3/uL (ref 150–400)
RBC: 5.28 MIL/uL (ref 4.22–5.81)
RDW: 14.9 % (ref 11.5–15.5)
WBC: 6.8 10*3/uL (ref 4.0–10.5)
nRBC: 0 % (ref 0.0–0.2)

## 2022-02-28 LAB — COMPREHENSIVE METABOLIC PANEL
ALT: 80 U/L — ABNORMAL HIGH (ref 0–44)
AST: 81 U/L — ABNORMAL HIGH (ref 15–41)
Albumin: 4.2 g/dL (ref 3.5–5.0)
Alkaline Phosphatase: 73 U/L (ref 38–126)
Anion gap: 8 (ref 5–15)
BUN: 11 mg/dL (ref 6–20)
CO2: 27 mmol/L (ref 22–32)
Calcium: 10 mg/dL (ref 8.9–10.3)
Chloride: 106 mmol/L (ref 98–111)
Creatinine, Ser: 1.44 mg/dL — ABNORMAL HIGH (ref 0.61–1.24)
GFR, Estimated: >60 mL/min (ref >60)
Glucose, Bld: 132 mg/dL — ABNORMAL HIGH (ref 70–99)
Potassium: 3.5 mmol/L (ref 3.5–5.1)
Sodium: 141 mmol/L (ref 135–145)
Total Bilirubin: 1 mg/dL (ref 0.3–1.2)
Total Protein: 8.1 g/dL (ref 6.5–8.1)

## 2022-02-28 MED ORDER — IOHEXOL 350 MG/ML SOLN
60.0000 mL | Freq: Once | INTRAVENOUS | Status: AC | PRN
Start: 2022-02-28 — End: 2022-02-28
  Administered 2022-02-28: 60 mL via INTRAVENOUS

## 2022-02-28 MED ORDER — LIDOCAINE 5 % EX PTCH: 1.0000 | MEDICATED_PATCH | CUTANEOUS | 0 refills | Status: DC

## 2022-02-28 MED ORDER — SODIUM CHLORIDE (PF) 0.9 % IJ SOLN
INTRAMUSCULAR | Status: AC
  Filled 2022-02-28: qty 50

## 2022-02-28 MED ORDER — DICLOFENAC SODIUM ER 100 MG PO TB24: 100.0000 mg | ORAL_TABLET | Freq: Every day | ORAL | 0 refills | Status: DC

## 2022-02-28 MED ORDER — AMLODIPINE BESYLATE 5 MG PO TABS: 5.0000 mg | ORAL_TABLET | Freq: Every day | ORAL | 0 refills | Status: DC

## 2022-02-28 MED ORDER — KETOROLAC TROMETHAMINE 30 MG/ML IJ SOLN
30.0000 mg | Freq: Once | INTRAMUSCULAR | Status: AC
  Administered 2022-02-28: 30 mg via INTRAVENOUS
  Filled 2022-02-28: qty 1

## 2022-02-28 MED ORDER — HALOPERIDOL LACTATE 5 MG/ML IJ SOLN
2.0000 mg | Freq: Once | INTRAMUSCULAR | Status: AC
Start: 2022-02-28 — End: 2022-02-28
  Administered 2022-02-28: 2 mg via INTRAVENOUS
  Filled 2022-02-28: qty 1

## 2022-02-28 MED ORDER — IOHEXOL 350 MG/ML SOLN
100.0000 mL | Freq: Once | INTRAVENOUS | Status: AC | PRN
  Administered 2022-02-28: 100 mL via INTRAVENOUS

## 2022-02-28 MED ORDER — METAXALONE 800 MG PO TABS: 800.0000 mg | ORAL_TABLET | Freq: Three times a day (TID) | ORAL | 0 refills | Status: DC

## 2022-02-28 MED ORDER — DEXTROSE 5 % IV SOLN
1000.0000 mg | Freq: Once | INTRAVENOUS | Status: AC
Start: 2022-02-28 — End: 2022-02-28
  Administered 2022-02-28: 1000 mg via INTRAVENOUS
  Filled 2022-02-28: qty 1000

## 2022-02-28 MED ORDER — METHOCARBAMOL 1000 MG/10ML IJ SOLN: 1000.0000 mg | Freq: Once | INTRAMUSCULAR | Status: DC

## 2022-02-28 MED ORDER — LIDOCAINE 5 % EX PTCH
2.0000 | MEDICATED_PATCH | CUTANEOUS | Status: DC
  Administered 2022-02-28: 2 via TRANSDERMAL
  Filled 2022-02-28: qty 2

## 2022-02-28 NOTE — ED Triage Notes (Signed)
Left sided neck pain radiating to left arm and hand with numbness x 4 days. Relief with heat and ibuprofen and worse with rest. Flexeril with no improvment

## 2022-02-28 NOTE — ED Notes (Signed)
Pt ambulatory to restroom w/out assistance. Provided pt with specimen cup and asked to provide sample.

## 2022-02-28 NOTE — ED Provider Notes (Signed)
[facility] DEPT Provider Note   CSN: [id number] Arrival date & time: 02/28/22  0101     History  Chief Complaint  Patient presents with   Numbness    Bobby Jones is a 41 y.o. male.   Illness Location:  Left neck Quality:  Pain and tingling in the arm with movement Severity:  Severe Onset quality:  Gradual Duration:  4 days Timing:  Constant Progression:  Worsening Chronicity:  New Context:  No trauma.  No chest pain no SOB. Relieved by:  Nothing Worsened by:  Nothing Ineffective treatments:  Ibuprofen Associated symptoms: no abdominal pain, no chest pain, no cough, no fever, no headaches, no loss of consciousness, no myalgias, no shortness of breath, no sore throat, no vomiting and no wheezing   Risk factors:  HTN, not on medication      Home Medications Prior to Admission medications   Medication Sig Start Date End Date Taking? Authorizing Provider  hydrochlorothiazide (HYDRODIURIL) 12.5 MG tablet Take 12.5 mg by mouth daily. 05/21/21   [provider]  ondansetron (ZOFRAN-ODT) 4 MG disintegrating tablet Take 1 tablet (4 mg total) by mouth every 8 (eight) hours as needed. 09/24/21   Francene Finders, PA-C  pravastatin (PRAVACHOL) 10 MG tablet Take 10 mg by mouth daily. 05/21/21   [provider]  propranolol (INDERAL) 40 MG tablet Take 40 mg by mouth 3 (three) times daily. 05/12/21   [provider]      Allergies    Patient has no known allergies.    Review of Systems   Review of Systems  Constitutional:  Negative for fever.  HENT:  Negative for sore throat.   Respiratory:  Negative for cough, shortness of breath and wheezing.   Cardiovascular:  Negative for chest pain.  Gastrointestinal:  Negative for abdominal pain and vomiting.  Musculoskeletal:  Positive for neck pain. Negative for myalgias.  Neurological:  Negative for loss of consciousness, weakness, numbness and headaches.  All other systems  reviewed and are negative.   Physical Exam Updated Vital Signs BP (!) 184/121   Pulse 100   Temp 98.2 F (36.8 C) (Oral)   Resp 19   Ht 6\' 2"  (1.88 m)   Wt 124 kg   SpO2 100%   BMI 35.10 kg/m  Physical Exam Vitals and nursing note reviewed.  Constitutional:      General: He is not in acute distress.    Appearance: He is well-developed. He is not diaphoretic.  HENT:     Head: Normocephalic and atraumatic.     Nose: Nose normal.  Eyes:     Conjunctiva/sclera: Conjunctivae normal.     Pupils: Pupils are equal, round, and reactive to light.  Neck:     Vascular: No carotid bruit.  Cardiovascular:     Rate and Rhythm: Normal rate and regular rhythm.  Pulmonary:     Effort: Pulmonary effort is normal.     Breath sounds: Normal breath sounds. No wheezing or rales.  Abdominal:     General: Bowel sounds are normal.     Palpations: Abdomen is soft.     Tenderness: There is no abdominal tenderness. There is no guarding or rebound.  Musculoskeletal:        General: No tenderness. Normal range of motion.     Cervical back: Normal range of motion and neck supple. No rigidity or tenderness.  Lymphadenopathy:     Cervical: No cervical adenopathy.  Skin:    General: Skin  is warm and dry.     Capillary Refill: Capillary refill takes less than 2 seconds.  Neurological:     General: No focal deficit present.     Mental Status: He is alert and oriented to person, place, and time.     Sensory: No sensory deficit.     Motor: No weakness.     Deep Tendon Reflexes: Reflexes normal.     ED Results / Procedures / Treatments   Labs (all labs ordered are listed, but only abnormal results are displayed) Results for orders placed or performed during the hospital encounter of 02/28/22  CBC with Differential  Result Value Ref Range   WBC 6.8 4.0 - 10.5 K/uL   RBC 5.28 4.22 - 5.81 MIL/uL   Hemoglobin 16.4 13.0 - 17.0 g/dL   HCT 13.0 86.5 - 78.4 %   MCV 93.4 80.0 - 100.0 fL   MCH 31.1  26.0 - 34.0 pg   MCHC 33.3 30.0 - 36.0 g/dL   RDW 69.6 29.5 - 28.4 %   Platelets 262 150 - 400 K/uL   nRBC 0.0 0.0 - 0.2 %   Neutrophils Relative % 45 %   Neutro Abs 3.1 1.7 - 7.7 K/uL   Lymphocytes Relative 42 %   Lymphs Abs 2.9 0.7 - 4.0 K/uL   Monocytes Relative 9 %   Monocytes Absolute 0.6 0.1 - 1.0 K/uL   Eosinophils Relative 3 %   Eosinophils Absolute 0.2 0.0 - 0.5 K/uL   Basophils Relative 1 %   Basophils Absolute 0.0 0.0 - 0.1 K/uL   Immature Granulocytes 0 %   Abs Immature Granulocytes 0.02 0.00 - 0.07 K/uL  Comprehensive metabolic panel  Result Value Ref Range   Sodium 141 135 - 145 mmol/L   Potassium 3.5 3.5 - 5.1 mmol/L   Chloride 106 98 - 111 mmol/L   CO2 27 22 - 32 mmol/L   Glucose, Bld 132 (H) 70 - 99 mg/dL   BUN 11 6 - 20 mg/dL   Creatinine, Ser 1.32 (H) 0.61 - 1.24 mg/dL   Calcium 44.0 8.9 - 10.2 mg/dL   Total Protein 8.1 6.5 - 8.1 g/dL   Albumin 4.2 3.5 - 5.0 g/dL   AST 81 (H) 15 - 41 U/L   ALT 80 (H) 0 - 44 U/L   Alkaline Phosphatase 73 38 - 126 U/L   Total Bilirubin 1.0 0.3 - 1.2 mg/dL   GFR, Estimated >72 >53 mL/min   Anion gap 8 5 - 15  Troponin I (High Sensitivity)  Result Value Ref Range   Troponin I (High Sensitivity) 4 <18 ng/L  Troponin I (High Sensitivity)  Result Value Ref Range   Troponin I (High Sensitivity) 5 <18 ng/L   CT Angio Chest Aorta W and/or Wo Contrast  Result Date: 02/28/2022 CLINICAL DATA:  41 year old male with left neck pain radiating to the arm for 4 days. Syncope, presyncope. EXAM: CT ANGIOGRAPHY CHEST WITH CONTRAST TECHNIQUE: Multidetector CT imaging of the chest was performed using the standard protocol during bolus administration of intravenous contrast. Multiplanar CT image reconstructions and MIPs were obtained to evaluate the vascular anatomy. RADIATION DOSE REDUCTION: This exam was performed according to the departmental dose-optimization program which includes automated exposure control, adjustment of the mA and/or kV  according to patient size and/or use of iterative reconstruction technique. CONTRAST:  68mL OMNIPAQUE IOHEXOL 350 MG/ML SOLN, OMNIPAQUE IOHEXOL 350 MG/ML SOLN COMPARISON:  CTA neck the same day reported separately. Chest radiographs 05/29/2021.  FINDINGS: Cardiovascular: Adequate contrast timing for the thoracic aorta and pulmonary arteries. Negative visible aorta. Proximal great vessels appear normal. No pulmonary artery filling defect identified. No cardiomegaly or pericardial effusion. No calcified coronary artery atherosclerosis is evident. Mediastinum/Nodes: Negative. No mediastinal mass or lymphadenopathy. Lungs/Pleura: Major airways are patent. Low lung volumes with mild atelectasis predominantly at the lung bases. No pleural effusion. No pulmonary consolidation or convincing inflammation. Upper Abdomen: Hepatic steatosis. Negative visible spleen and stomach. Musculoskeletal: Mild chronic appearing T9 superior endplate deformity, perhaps a Schmorl's node. No acute osseous abnormality identified. Gynecomastia. Review of the MIP images confirms the above findings. IMPRESSION: 1. Negative thoracic aorta.  No pulmonary embolus identified. 2. Low lung volumes with atelectasis. 3. Hepatic steatosis. Electronically Signed   By: Odessa Fleming M.D.   On: 02/28/2022 06:28   CT Angio Neck W and/or Wo Contrast  Result Date: 02/28/2022 CLINICAL DATA:  42 year old male with left neck pain radiating to the arm for 4 days. Syncope, presyncope. EXAM: CT ANGIOGRAPHY NECK TECHNIQUE: Multidetector CT imaging of the neck was performed using the standard protocol during bolus administration of intravenous contrast. Multiplanar CT image reconstructions and MIPs were obtained to evaluate the vascular anatomy. Carotid stenosis measurements (when applicable) are obtained utilizing NASCET criteria, using the distal internal carotid diameter as the denominator. RADIATION DOSE REDUCTION: This exam was performed according to the  departmental dose-optimization program which includes automated exposure control, adjustment of the mA and/or kV according to patient size and/or use of iterative reconstruction technique. CONTRAST:  29mL OMNIPAQUE IOHEXOL 350 MG/ML SOLN, OMNIPAQUE IOHEXOL 350 MG/ML SOLN COMPARISON:  Chest CTA reported separately today. Cervical spine radiographs 04/03/2006. FINDINGS: Skeleton: Mild straightening of cervical lordosis. Chronic disc and endplate degeneration appears maximal at C4-C5. No acute osseous abnormality identified. Large left maxillary sinus mucous retention cysts. Other Visualized paranasal sinuses and mastoids are clear. Upper chest: Chest CTA is reported separately. Other neck: Negative thyroid, larynx, pharynx, parapharyngeal spaces, retropharyngeal space, sublingual space, submandibular spaces, masticator and parotid spaces. Maximal level 1 lymph nodes, up to 10 mm short axis. But other lymph node stations appear symmetric and within normal limits. No cystic or necrotic nodes. Aortic arch: Normal arch, 3 vessel arch configuration. Right carotid system: Negative. Left carotid system: Negative. Vertebral arteries: Proximal right subclavian artery and right vertebral artery origin are normal. Right V1 segment mildly obscured by dense adjacent venous contrast. Right vertebral artery appears patent and within normal limits to the vertebrobasilar junction. Normal right PICA origin. Proximal left subclavian artery is normal. Left vertebral artery origin is within normal limits. Left vertebral artery appears non dominant throughout the neck and to the skull base with a uniform vessel caliber. No convincing irregularity or dissection in the neck. Left V4 segment and left PICA origin are patent. Small left V4 contribution to the vertebrobasilar junction. CTA HEAD Posterior circulation: Distal vertebral arteries as above. Patent vertebrobasilar junction and visible basilar artery. Anterior circulation:  Visible ICA siphons appear patent and within normal limits. Venous sinuses: Patent transverse and sigmoid venous sinuses. Anatomic variants: Dominant right vertebral artery. Other findings: Grossly negative visible brain parenchyma. Visualized orbit soft tissues are within normal limits. Review of the MIP images confirms the above findings IMPRESSION: 1. No convincing arterial dissection or abnormality in the neck or at the skull base. Non-dominant left vertebral artery. 2. No acute finding identified in the neck. Chest CTA is reported separately. Electronically Signed   By: Odessa Fleming M.D.   On: 02/28/2022 06:25  Radiology CT Angio Neck W and/or Wo Contrast  Result Date: 02/28/2022 CLINICAL DATA:  41 year old male with left neck pain radiating to the arm for 4 days. Syncope, presyncope. EXAM: CT ANGIOGRAPHY NECK TECHNIQUE: Multidetector CT imaging of the neck was performed using the standard protocol during bolus administration of intravenous contrast. Multiplanar CT image reconstructions and MIPs were obtained to evaluate the vascular anatomy. Carotid stenosis measurements (when applicable) are obtained utilizing NASCET criteria, using the distal internal carotid diameter as the denominator. RADIATION DOSE REDUCTION: This exam was performed according to the departmental dose-optimization program which includes automated exposure control, adjustment of the mA and/or kV according to patient size and/or use of iterative reconstruction technique. CONTRAST:  60mL OMNIPAQUE IOHEXOL 350 MG/ML SOLN, 19mL OMNIPAQUE IOHEXOL 350 MG/ML SOLN COMPARISON:  Chest CTA reported separately today. Cervical spine radiographs 04/03/2006. FINDINGS: Skeleton: Mild straightening of cervical lordosis. Chronic disc and endplate degeneration appears maximal at C4-C5. No acute osseous abnormality identified. Large left maxillary sinus mucous retention cysts. Other Visualized paranasal sinuses and mastoids are clear. Upper chest:  Chest CTA is reported separately. Other neck: Negative thyroid, larynx, pharynx, parapharyngeal spaces, retropharyngeal space, sublingual space, submandibular spaces, masticator and parotid spaces. Maximal level 1 lymph nodes, up to 10 mm short axis. But other lymph node stations appear symmetric and within normal limits. No cystic or necrotic nodes. Aortic arch: Normal arch, 3 vessel arch configuration. Right carotid system: Negative. Left carotid system: Negative. Vertebral arteries: Proximal right subclavian artery and right vertebral artery origin are normal. Right V1 segment mildly obscured by dense adjacent venous contrast. Right vertebral artery appears patent and within normal limits to the vertebrobasilar junction. Normal right PICA origin. Proximal left subclavian artery is normal. Left vertebral artery origin is within normal limits. Left vertebral artery appears non dominant throughout the neck and to the skull base with a uniform vessel caliber. No convincing irregularity or dissection in the neck. Left V4 segment and left PICA origin are patent. Small left V4 contribution to the vertebrobasilar junction. CTA HEAD Posterior circulation: Distal vertebral arteries as above. Patent vertebrobasilar junction and visible basilar artery. Anterior circulation: Visible ICA siphons appear patent and within normal limits. Venous sinuses: Patent transverse and sigmoid venous sinuses. Anatomic variants: Dominant right vertebral artery. Other findings: Grossly negative visible brain parenchyma. Visualized orbit soft tissues are within normal limits. Review of the MIP images confirms the above findings IMPRESSION: 1. No convincing arterial dissection or abnormality in the neck or at the skull base. Non-dominant left vertebral artery. 2. No acute finding identified in the neck. Chest CTA is reported separately. Electronically Signed   By: Genevie Ann M.D.   On: 02/28/2022 06:25    Procedures Procedures    Medications  Ordered in ED Medications  lidocaine (LIDODERM) 5 % 2 patch (2 patches Transdermal Patch Applied 02/28/22 0257)  methocarbamol (ROBAXIN) 1,000 mg in dextrose 5 % 100 mL IVPB (0 mg Intravenous Stopped 02/28/22 0348)  haloperidol lactate (HALDOL) injection 2 mg (2 mg Intravenous Given 02/28/22 0256)  sodium chloride (PF) 0.9 % injection (  Given by Other 02/28/22 0348)  iohexol (OMNIPAQUE) 350 MG/ML injection 100 mL (100 mLs Intravenous Contrast Given 02/28/22 0546)  iohexol (OMNIPAQUE) 350 MG/ML injection 60 mL (60 mLs Intravenous Contrast Given 02/28/22 0546)  sodium chloride (PF) 0.9 % injection (  Given by Other 02/28/22 [id number])    ED Course/ Medical Decision Making/ A&P  Medical Decision Making Patient with neck pain and paresthesia of the arm with movement. No CP not SOB  Amount and/or Complexity of Data Reviewed External Data Reviewed: notes.    Details: previous notes reviewed Labs: ordered.    Details: all labs reviewed: normal white count, 6.8, normal normal hemoglobin 16.4, platelets 262K. Normal sodium 141, potassium 3.5, normal BUn 11, elevated creatinine 1.44.  Elevated AST and ALT.  80/81 Radiology: ordered and independent interpretation performed.    Details: negative CTA, no dissection by me ECG/medicine tests: ordered and independent interpretation performed.    Details: NSR 86, did not cross over  Risk Prescription drug management. Risk Details: Patient ruled for MI and dissection in the ED.  NVI BUE.  No numbness or weakness.  Will refer to PMD regarding BP and neuro regarding paresthesia.  RX sent to pharmacy for pain medication and muscle relaxants.  Patient instructed to take the BP medication that I prescribed and follow up with both PMD and neurology.  Strict return.      Final Clinical Impression(s) / ED Diagnoses Final diagnoses:  Primary hypertension  Neck pain  Paresthesia  Return for intractable cough, coughing up blood, fevers > 100.4  unrelieved by medication, shortness of breath, intractable vomiting, chest pain, shortness of breath, weakness, numbness, changes in speech, facial asymmetry, abdominal pain, passing out, Inability to tolerate liquids or food, cough, altered mental status or any concerns. No signs of systemic illness or infection. The patient is nontoxic-appearing on exam and vital signs are within normal limits.  I have reviewed the triage vital signs and the nursing notes. Pertinent labs & imaging results that were available during my care of the patient were reviewed by me and considered in my medical decision making (see chart for details). After history, exam, and medical workup I feel the patient has been appropriately medically screened and is safe for discharge home. Pertinent diagnoses were discussed with the patient. Patient was given return precautions.     Rx / DC Orders ED Discharge Orders     None         Nikola Marone, MD 02/28/22 [id number]

## 2022-11-18 IMAGING — MR MR KNEE*L* W/O CM
9 series · 37 of 40 positions shown · non-contrast
Comparison: None.

CLINICAL DATA: Knee trauma.  Dislocated patella.

EXAM:
MRI OF THE LEFT KNEE WITHOUT CONTRAST
TECHNIQUE: Multiplanar, multisequence MR imaging of the knee was performed. No
intravenous contrast was administered.

[Series 18: T1 · coronal · left · 4.0mm · 0.56mm/px · 4 of 35 slices shown]
[im 1/35]
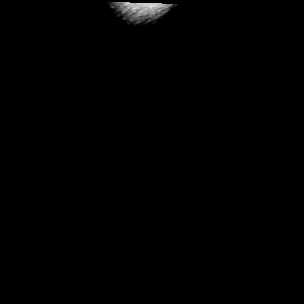
[im 12/35]
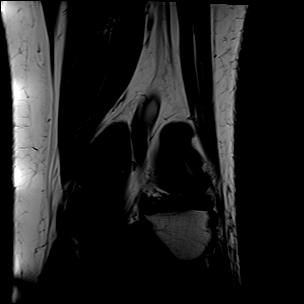
[im 23/35]
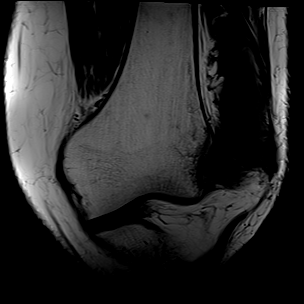
[im 35/35]
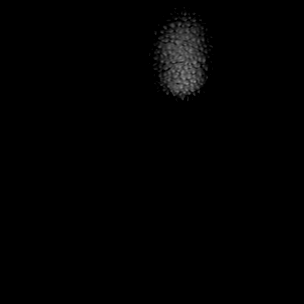

[Series 19: T2 fat-sat · coronal · left · 4.0mm · 0.66mm/px · 4 of 36 slices shown (1 of 4)]
[im 1/36]
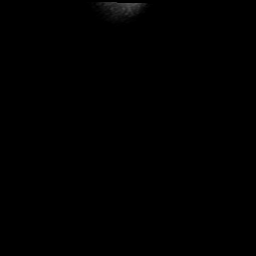
[im 12/36]
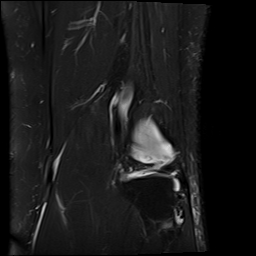
[im 24/36]
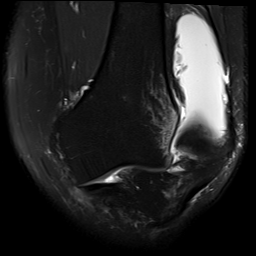
[im 36/36]
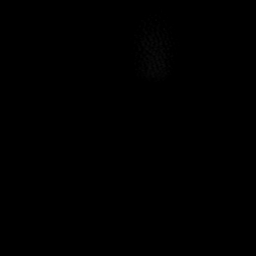

[Series 20: PD fat-sat · coronal · left · 3.0mm · 0.53mm/px · 5 of 42 slices shown (1 of 2)]
[im 1/42]
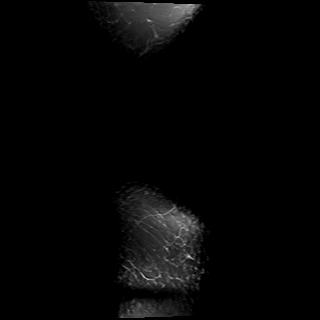
[im 11/42]
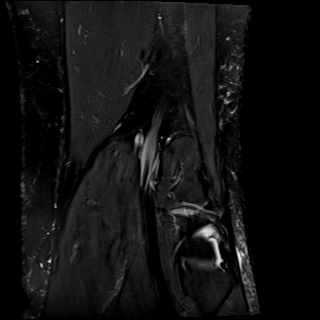
[im 21/42]
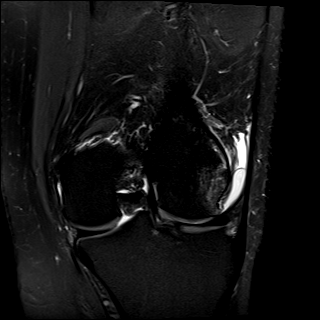
[im 31/42]
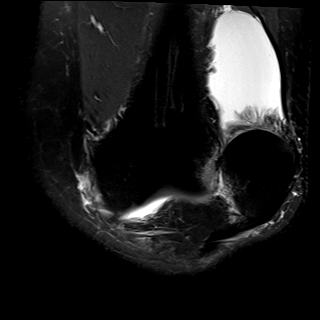
[im 42/42]
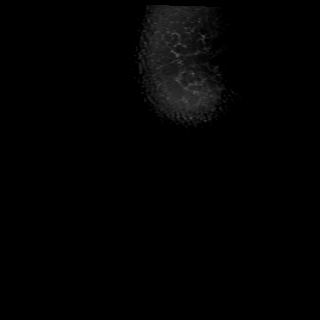

[Series 21: PD fat-sat · sagittal · left · 3.0mm · 0.66mm/px · 5 of 42 slices shown (2 of 2)]
[im 1/42]
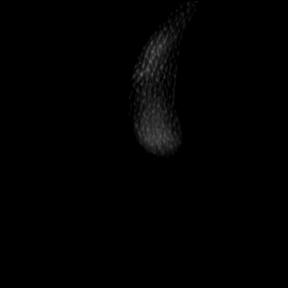
[im 11/42]
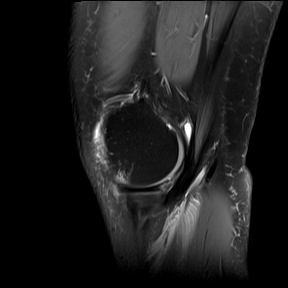
[im 21/42]
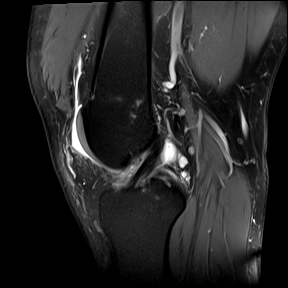
[im 31/42]
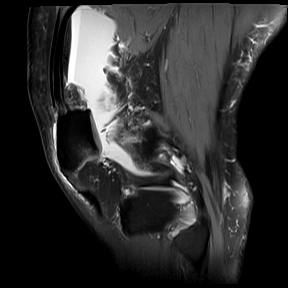
[im 42/42]
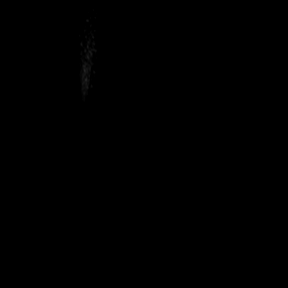

[Series 22: T2 fat-sat · sagittal · left · 3.0mm · 0.66mm/px · 5 of 42 slices shown (2 of 4)]
[im 1/42]
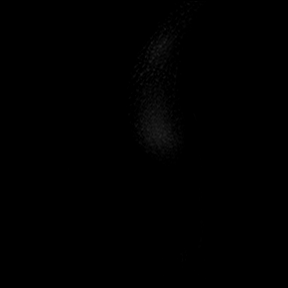
[im 11/42]
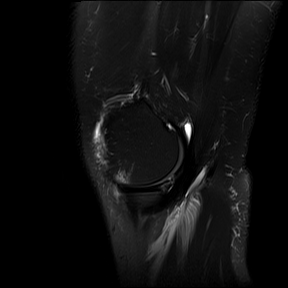
[im 21/42]
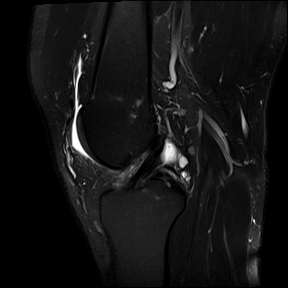
[im 31/42]
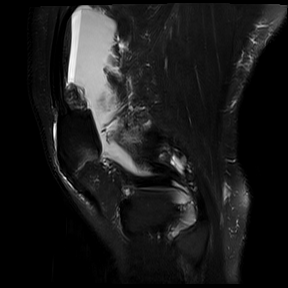
[im 42/42]
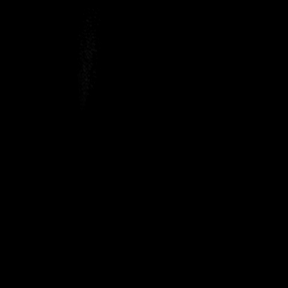

[Series 23: T1 fat-sat · axial · left · 4.0mm · 0.62mm/px · z∈[-131,-86]mm · 2 of 40 slices shown]
[im 1/40]
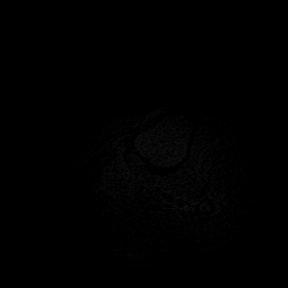
[im 10/40]
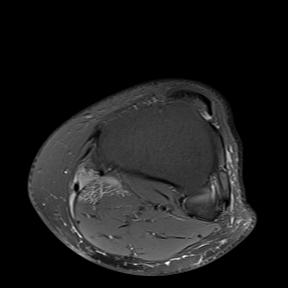

[Series 24: T2 fat-sat · axial · left · 4.0mm · 0.40mm/px · z∈[-128,+60]mm · 5 of 39 slices shown (3 of 4)]
[im 1/39]
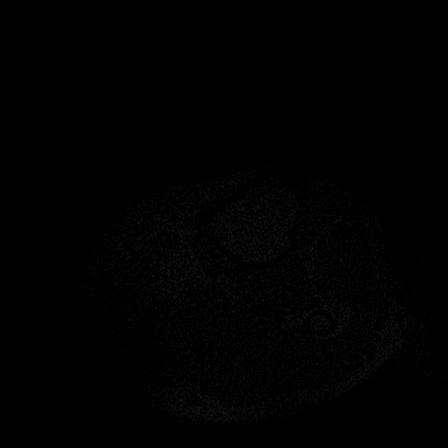
[im 10/39]
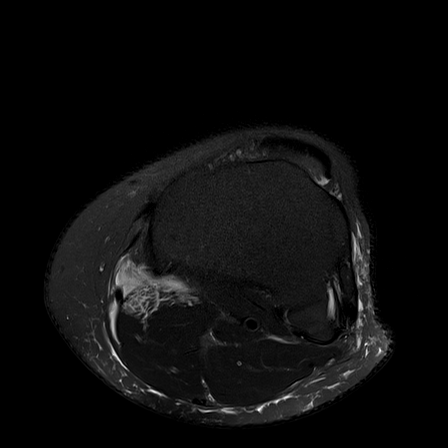
[im 20/39]
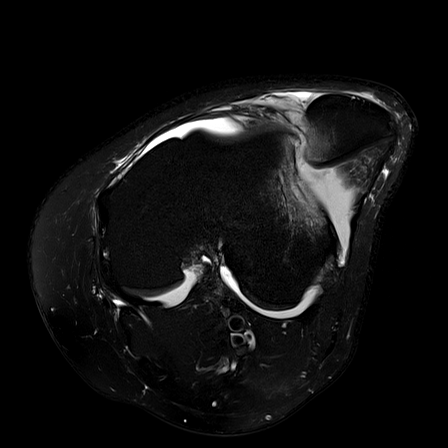
[im 29/39]
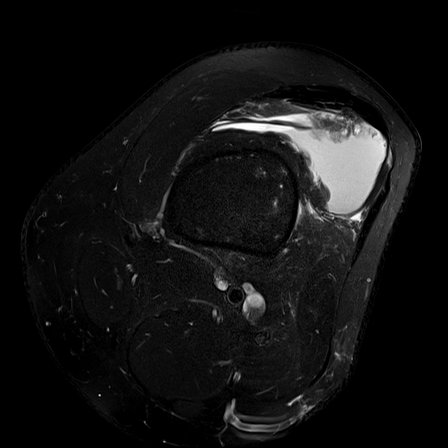
[im 39/39]
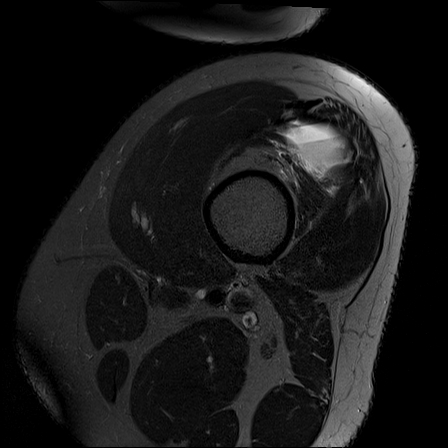

[Series 25: PD · coronal · left · 2.0mm · 0.47mm/px · 2 of 20 slices shown]
[im 1/20]
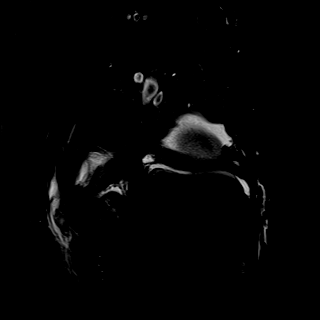
[im 20/20]
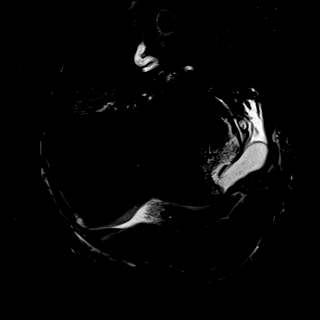

[Series 26: T2 fat-sat · sagittal · left · 3.0mm · 0.42mm/px · 5 of 44 slices shown (4 of 4)]
[im 1/44]
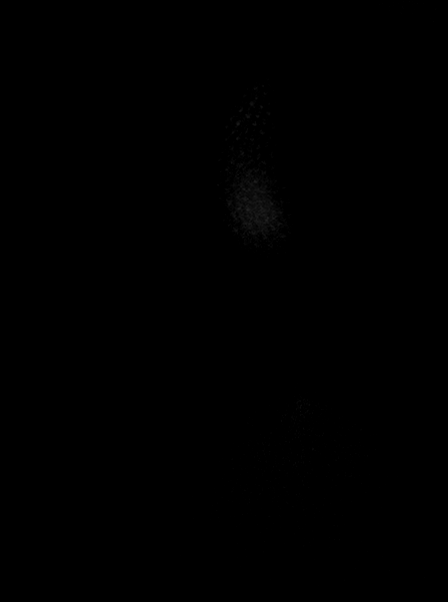
[im 11/44]
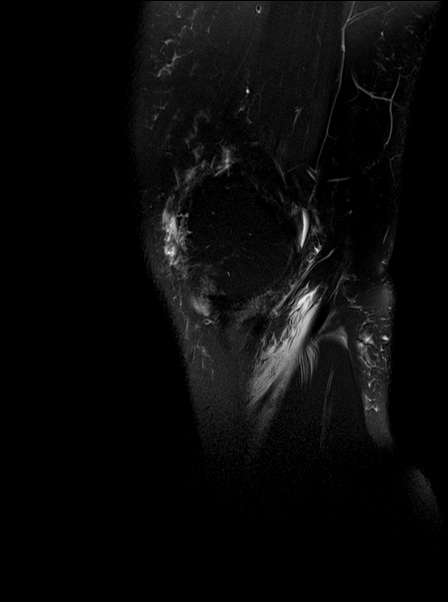
[im 22/44]
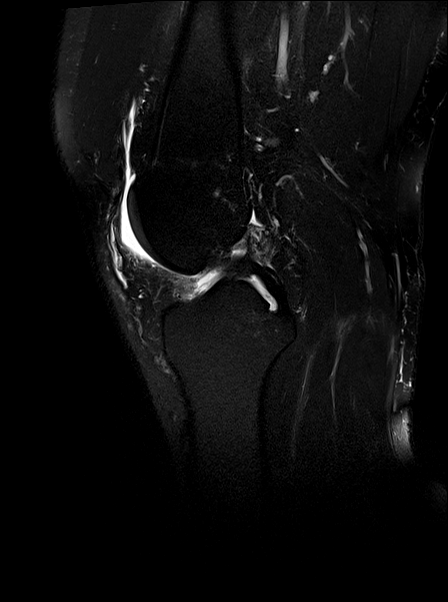
[im 33/44]
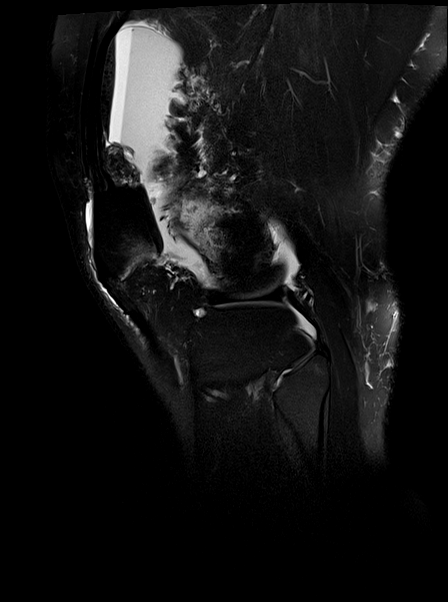
[im 44/44]
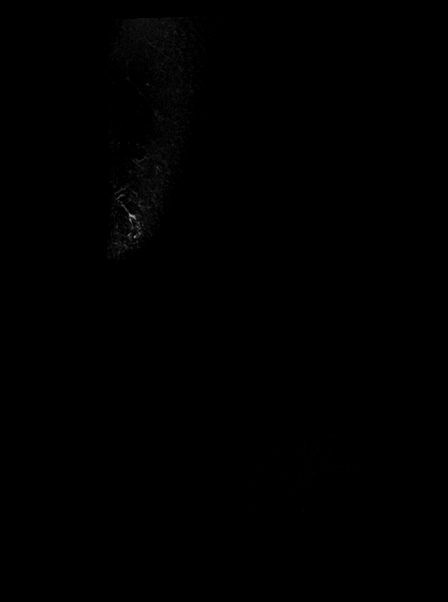

[37 of 40 positions shown; findings below may reference images not displayed]

FINDINGS: MENISCI

Medial: Intact.

Lateral: Intact.

LIGAMENTS

Cruciates: ACL and PCL are intact.

Collaterals: Medial collateral ligament is intact. Lateral
collateral ligament complex is intact.

CARTILAGE

Patellofemoral: Mild partial-thickness cartilage loss of the
periphery of the lateral trochlea.

Medial:  No chondral defect.

Lateral: Partial thickness cartilage loss of the lateral
femorotibial compartment with areas of full-thickness cartilage
fissuring.

JOINT: Large joint effusion. Normal Hoffa's fat. No plical
thickening.

POPLITEAL FOSSA: Popliteus tendon is intact. Tiny Baker's cyst.

EXTENSOR MECHANISM: Intact quadriceps tendon. Intact patellar
tendon. Intact lateral patellar retinaculum. Complete tear of the
patellar insertion of the medial patellar retinaculum and MPFL.
TT-TG distance 24 mm.

BONES: Severe bone marrow edema in the periphery of the
anterolateral femoral condyle with cortical irregularity consistent
with an impaction fracture with mild depression. Bone marrow edema
in the periphery of the medial patella with cortical irregularity
most consistent with a subtle nondisplaced fracture. Lateral
patellar dislocation with the patella perched on the lateral
trochlea.

Other: No fluid collection or hematoma. Muscles are normal.
IMPRESSION: 1. Findings consistent with transient lateral patellar dislocation.
Complete tear of the patellar insertion of the medial patellar
retinaculum and MPFL.
2. Severe osseous contusion of the periphery of the anterolateral
femoral condyle with cortical irregularity consistent with an
impaction fracture with mild depression.
3. Mild osseous contusion of the periphery of the medial patella
with cortical irregularity most consistent with a subtle
nondisplaced fracture.
4. Lateral patellar dislocation with the patella perched on the
lateral trochlea. TT-TG distance 24 mm.
5. Partial thickness cartilage loss of the lateral femorotibial
compartment with areas of full-thickness cartilage fissuring.

## 2023-03-18 ENCOUNTER — Encounter (HOSPITAL_BASED_OUTPATIENT_CLINIC_OR_DEPARTMENT_OTHER): Payer: Self-pay | Admitting: Emergency Medicine

## 2023-03-18 ENCOUNTER — Emergency Department (HOSPITAL_BASED_OUTPATIENT_CLINIC_OR_DEPARTMENT_OTHER)
Admission: EM | Admit: 2023-03-18 | Discharge: 2023-03-18 | Disposition: A | Discharge status: Home / Self Care | Payer: Worker's Compensation | Source: Home / Self Care | Attending: Emergency Medicine | Admitting: Emergency Medicine

## 2023-03-18 ENCOUNTER — Emergency Department (HOSPITAL_BASED_OUTPATIENT_CLINIC_OR_DEPARTMENT_OTHER): Payer: Worker's Compensation

## 2023-03-18 ENCOUNTER — Other Ambulatory Visit: Payer: Self-pay

## 2023-03-18 DIAGNOSIS — X509XXA Other and unspecified overexertion or strenuous movements or postures, initial encounter: Secondary | ICD-10-CM | POA: Insufficient documentation

## 2023-03-18 DIAGNOSIS — M2392 Unspecified internal derangement of left knee: Secondary | ICD-10-CM | POA: Insufficient documentation

## 2023-03-18 DIAGNOSIS — I10 Essential (primary) hypertension: Secondary | ICD-10-CM | POA: Insufficient documentation

## 2023-03-18 DIAGNOSIS — Z79899 Other long term (current) drug therapy: Secondary | ICD-10-CM | POA: Insufficient documentation

## 2023-03-18 DIAGNOSIS — S8992XA Unspecified injury of left lower leg, initial encounter: Secondary | ICD-10-CM | POA: Diagnosis present

## 2023-03-18 MED ORDER — HYDROCODONE-ACETAMINOPHEN 5-325 MG PO TABS
1.0000 | ORAL_TABLET | Freq: Once | ORAL | Status: AC
  Administered 2023-03-18: 1 via ORAL
  Filled 2023-03-18: qty 1

## 2023-03-18 MED ORDER — HYDROCODONE-ACETAMINOPHEN 5-325 MG PO TABS: 1.0000 | ORAL_TABLET | ORAL | 0 refills | Status: DC | PRN

## 2023-03-18 MED ORDER — IBUPROFEN 600 MG PO TABS: 600.0000 mg | ORAL_TABLET | Freq: Four times a day (QID) | ORAL | 0 refills | Status: DC | PRN

## 2023-03-18 MED ORDER — IBUPROFEN 800 MG PO TABS
800.0000 mg | ORAL_TABLET | Freq: Once | ORAL | Status: AC
  Administered 2023-03-18: 800 mg via ORAL
  Filled 2023-03-18: qty 1

## 2023-03-18 NOTE — ED Triage Notes (Signed)
Patient arrived via POV c/o left knee injury at work x 90 min pta. 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 Patient had previous sx to left knee. Patient is AO x 4, VS WDL, unable to stand at this time.
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
## 2023-07-23 ENCOUNTER — Ambulatory Visit
Admission: EM | Admit: 2023-07-23 | Discharge: 2023-07-23 | Disposition: A | Discharge status: Home / Self Care | Payer: BC Managed Care – PPO

## 2023-07-23 ENCOUNTER — Encounter: Payer: Self-pay | Admitting: Emergency Medicine

## 2023-07-23 DIAGNOSIS — J069 Acute upper respiratory infection, unspecified: Secondary | ICD-10-CM

## 2023-07-23 NOTE — ED Triage Notes (Signed)
 Sore throat, body aches, and congestion x3 days with diarrhea x1 day. 2 semi-liquid stools today. Denies fevers. Pt has relief with dayquil/nyquil at home. Does not want to complete any testing. Mainly needs a note for work to be out for a few days to recover per supervisor.

## 2023-07-24 NOTE — ED Provider Notes (Signed)
 [facility]    CSN: [id number] Arrival date & time: 07/23/23  1759      History   Chief Complaint Chief Complaint  Patient presents with   Sore Throat   Fatigue   Diarrhea    HPI Bobby Jones is a 43 y.o. male.   Patient here today for evaluation of sore throat, body aches and congestion they have had for 3 days.  He also reports diarrhea for 1 day.  He has tried taking DayQuil and NyQuil at home with mild relief.  He declines screening for COVID or flu but states he needs a couple more days off work for rest.  The history is provided by the patient.  Sore Throat Pertinent negatives include no abdominal pain and no shortness of breath.  Diarrhea Associated symptoms: chills and myalgias   Associated symptoms: no abdominal pain, no fever and no vomiting     Past Medical History:  Diagnosis Date   Hyperlipidemia    Hypertension     There are no active problems to display for this patient.   Past Surgical History:  Procedure Laterality Date   CLOSED REDUCTION PATELLAR Left 07/05/2020   Procedure: ATTEMPTED CLOSED REDUCTION PATELLA;  Surgeon: Sharl Selinda Dover, MD;  Location: [facility] OR;  Service: Orthopedics;  Laterality: Left;   ORIF PATELLA Left 07/05/2020   Procedure: OPEN REDUCTION OF LATERAL PATELLA DISLOCATION;  Surgeon: Sharl Selinda Dover, MD;  Location: [facility] OR;  Service: Orthopedics;  Laterality: Left;       Home Medications    Prior to Admission medications   Medication Sig Start Date End Date Taking? Authorizing Provider  hydrochlorothiazide (HYDRODIURIL) 25 MG tablet Take 25 mg by mouth daily.   Yes [provider]  pravastatin (PRAVACHOL) 10 MG tablet Take 1 tablet by mouth daily. 02/27/23  Yes [provider]  propranolol ER (INDERAL LA) 60 MG 24 hr capsule Take 60 mg by mouth daily.   Yes [provider]  amLODipine  (NORVASC ) 5 MG tablet Take 1 tablet (5 mg total) by mouth daily. Patient not taking: Reported  on 07/23/2023 02/28/22   Palumbo, April, MD  amLODipine  (NORVASC ) 5 MG tablet amlodipine  5 mg tablet  TAKE 1 TABLET (5 MG TOTAL) BY MOUTH DAILY. Patient not taking: Reported on 07/23/2023    [provider]  Diclofenac  Sodium CR 100 MG 24 hr tablet Take 1 tablet (100 mg total) by mouth daily. Patient not taking: Reported on 07/23/2023 02/28/22   Palumbo, April, MD  gabapentin (NEURONTIN) 100 MG capsule gabapentin 100 mg capsule  TAKE 1 CAPSULE BY MOUTH THREE TIMES DAILY Patient not taking: Reported on 07/23/2023    [provider]  hydrochlorothiazide (HYDRODIURIL) 12.5 MG tablet Take 12.5 mg by mouth daily. Patient not taking: Reported on 07/23/2023 05/21/21   [provider]  HYDROcodone -acetaminophen  (NORCO) 7.5-325 MG tablet hydrocodone  7.5 mg-acetaminophen  325 mg tablet  TAKE 1 TABLET BY MOUTH EVERY 6 HOURS AS NEEDED FOR MODERATE PAIN Patient not taking: Reported on 07/23/2023    [provider]  HYDROcodone -acetaminophen  (NORCO/VICODIN) 5-325 MG tablet Take 1 tablet by mouth every 4 (four) hours as needed. Patient not taking: Reported on 07/23/2023 03/18/23   Dean Clarity, MD  ibuprofen  (ADVIL ) 600 MG tablet Take 1 tablet (600 mg total) by mouth every 6 (six) hours as needed. 03/18/23   Dean Clarity, MD  lidocaine  (LIDODERM ) 5 % Place 1 patch onto the skin daily. Remove & Discard patch within 12 hours or as directed by  MD Patient not taking: Reported on 07/23/2023 02/28/22   Palumbo, April, MD  meloxicam (MOBIC) 15 MG tablet meloxicam 15 mg tablet  TAKE 1 TABLET BY MOUTH ONCE DAILY WITH MEALS Patient not taking: Reported on 07/23/2023    [provider]  meloxicam (MOBIC) 7.5 MG tablet meloxicam 7.5 mg tablet  TAKE 1 TABLET BY MOUTH TWICE DAILY WITH MEALS Patient not taking: Reported on 07/23/2023    [provider]  metaxalone  (SKELAXIN ) 800 MG tablet Take 1 tablet (800 mg total) by mouth 3 (three) times daily. Patient not taking: Reported on 07/23/2023  02/28/22   Palumbo, April, MD  ondansetron  (ZOFRAN -ODT) 4 MG disintegrating tablet Take 1 tablet (4 mg total) by mouth every 8 (eight) hours as needed. Patient not taking: Reported on 07/23/2023 09/24/21   Billy Asberry FALCON, PA-C  pravastatin (PRAVACHOL) 10 MG tablet Take 10 mg by mouth daily. Patient not taking: Reported on 07/23/2023 05/21/21   [provider]  propranolol (INDERAL) 40 MG tablet Take 40 mg by mouth 3 (three) times daily. 05/12/21   [provider]  propranolol (INDERAL) 40 MG tablet Take 1 tablet by mouth 2 (two) times daily.    [provider]    Family History Family History  Problem Relation Age of Onset   Diabetes Father    Hypertension Other    Diabetes Other    Coronary artery disease Other     Social History Social History   Tobacco Use   Smoking status: Never   Smokeless tobacco: Never  Vaping Use   Vaping status: Never Used  Substance Use Topics   Alcohol use: No   Drug use: No     Allergies   Patient has no known allergies.   Review of Systems Review of Systems  Constitutional:  Positive for chills. Negative for fever.  HENT:  Positive for congestion and sore throat. Negative for ear pain.   Eyes:  Negative for discharge and redness.  Respiratory:  Positive for cough. Negative for shortness of breath.   Gastrointestinal:  Positive for diarrhea. Negative for abdominal pain, nausea and vomiting.  Musculoskeletal:  Positive for myalgias.     Physical Exam Triage Vital Signs ED Triage Vitals  Encounter Vitals Group     BP 07/23/23 1949 (!) 143/89     Systolic BP Percentile --      Diastolic BP Percentile --      Pulse Rate 07/23/23 1949 73     Resp 07/23/23 1949 18     Temp 07/23/23 1949 98.4 F (36.9 C)     Temp Source 07/23/23 1949 Oral     SpO2 07/23/23 1949 98 %     Weight --      Height --      Head Circumference --      Peak Flow --      Pain Score 07/23/23 1951 8     Pain Loc --      Pain Education --       Exclude from Growth Chart --    No data found.  Updated Vital Signs BP (!) 143/89 (BP Location: Left Arm)   Pulse 73   Temp 98.4 F (36.9 C) (Oral)   Resp 18   SpO2 98%   Visual Acuity Right Eye Distance:   Left Eye Distance:   Bilateral Distance:    Right Eye Near:   Left Eye Near:    Bilateral Near:     Physical Exam Vitals and  nursing note reviewed.  Constitutional:      General: He is not in acute distress.    Appearance: Normal appearance. He is not ill-appearing.  HENT:     Head: Normocephalic and atraumatic.     Right Ear: Tympanic membrane normal.     Left Ear: Tympanic membrane normal.     Nose: Congestion present.     Mouth/Throat:     Mouth: Mucous membranes are moist.     Pharynx: Oropharynx is clear. No oropharyngeal exudate or posterior oropharyngeal erythema.  Eyes:     Conjunctiva/sclera: Conjunctivae normal.  Cardiovascular:     Rate and Rhythm: Normal rate and regular rhythm.     Heart sounds: Normal heart sounds. No murmur heard. Pulmonary:     Effort: Pulmonary effort is normal. No respiratory distress.     Breath sounds: Normal breath sounds. No wheezing, rhonchi or rales.  Skin:    General: Skin is warm and dry.  Neurological:     Mental Status: He is alert.  Psychiatric:        Mood and Affect: Mood normal.        Thought Content: Thought content normal.      UC Treatments / Results  Labs (all labs ordered are listed, but only abnormal results are displayed) Labs Reviewed - No data to display  EKG   Radiology No results found.  Procedures Procedures (including critical care time)  Medications Ordered in UC Medications - No data to display  Initial Impression / Assessment and Plan / UC Course  I have reviewed the triage vital signs and the nursing notes.  Pertinent labs & imaging results that were available during my care of the patient were reviewed by me and considered in my medical decision making (see chart for  details).     Suspect viral etiology of symptoms.  Screening deferred.  Work note provided.  Recommended symptomatic treatment, increase fluids and rest with follow-up if symptoms fail to improve or worsen.   Final Clinical Impressions(s) / UC Diagnoses   Final diagnoses:  Acute upper respiratory infection   Discharge Instructions   None    ED Prescriptions   None    PDMP not reviewed this encounter.   Billy Asberry FALCON, PA-C 07/24/23 1047

## 2023-07-27 ENCOUNTER — Ambulatory Visit
Admission: RE | Admit: 2023-07-27 | Discharge: 2023-07-27 | Disposition: A | Discharge status: Home / Self Care | Payer: BC Managed Care – PPO | Source: Ambulatory Visit | Attending: Physician Assistant | Admitting: Physician Assistant

## 2023-07-27 ENCOUNTER — Other Ambulatory Visit: Payer: Self-pay

## 2023-07-27 VITALS — BP 126/88 | HR 85 | Temp 98.3°F | Resp 16

## 2023-07-27 DIAGNOSIS — R051 Acute cough: Secondary | ICD-10-CM | POA: Diagnosis not present

## 2023-07-27 DIAGNOSIS — J069 Acute upper respiratory infection, unspecified: Secondary | ICD-10-CM

## 2023-07-27 MED ORDER — ALBUTEROL SULFATE HFA 108 (90 BASE) MCG/ACT IN AERS: 1.0000 | INHALATION_SPRAY | Freq: Four times a day (QID) | RESPIRATORY_TRACT | 0 refills | Status: AC | PRN

## 2023-07-27 MED ORDER — AZITHROMYCIN 250 MG PO TABS: ORAL_TABLET | ORAL | 0 refills | Status: AC

## 2023-07-27 NOTE — Discharge Instructions (Addendum)
 Recommend Mucinex and Flonase for congestion  Drink plenty of fluids, rest Take antibiotic as prescribed Can use inhaler as needed for wheezing  Return if no improvement or symptoms become worse.

## 2023-07-27 NOTE — ED Provider Notes (Signed)
 [facility]    CSN: [id number] Arrival date & time: 07/27/23  0845      History   Chief Complaint Chief Complaint  Patient presents with   Generalized Body Aches    I came in on last Wednesday wasn't feeling well was seen by the doctor and it was said that it was a upper respiratory infection It hasn't gotten any better . They said if it hasn't gotten better to come back in. - Entered by patient   Cough    HPI Bobby Jones is a 43 y.o. male.   Pt complains of persistent cough and congestion.  He reports he was seen here last week and since then his symptoms have only progressed.  He is now experiencing bodyaches fatigue and chills.  Productive cough. He is now experiencing more fatigue and shortness of breath.  Denies h/o asthma or lung disease.  He is taking coricidin with minimal relief.. Denies fever.   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 Reports cough has been keeping him up at night.    Past Medical History:  Diagnosis Date   Hyperlipidemia    Hypertension     There are no active problems to display for this patient.   Past Surgical History:  Procedure Laterality Date   CLOSED REDUCTION PATELLAR Left 07/05/2020   Procedure: ATTEMPTED CLOSED REDUCTION PATELLA;  Surgeon: Sharl Selinda Dover, MD;  Location: [facility] OR;  Service: Orthopedics;  Laterality: Left;   ORIF PATELLA Left 07/05/2020   Procedure: OPEN REDUCTION OF LATERAL PATELLA DISLOCATION;  Surgeon: Sharl Selinda Dover, MD;  Location: [facility] OR;  Service: Orthopedics;  Laterality: Left;       Home Medications    Prior to Admission medications   Medication Sig Start Date End Date Taking? Authorizing Provider  albuterol  (VENTOLIN  HFA) 108 (90 Base) MCG/ACT inhaler Inhale 1-2 puffs into the lungs every 6 (six) hours as needed for wheezing or shortness of breath. 07/27/23  Yes Ward, Harlene PEDLAR, PA-C  azithromycin  (ZITHROMAX  Z-PAK) 250 MG tablet Take two tablets by mouth on the  first day and then 1 tablet by mouth for remaining day. 07/27/23  Yes Ward, Harlene PEDLAR, PA-C  hydrochlorothiazide (HYDRODIURIL) 25 MG tablet Take 25 mg by mouth daily.   Yes [provider]  pravastatin (PRAVACHOL) 10 MG tablet Take 1 tablet by mouth daily. 02/27/23  Yes [provider]  propranolol ER (INDERAL LA) 60 MG 24 hr capsule Take 60 mg by mouth daily.   Yes [provider]  amLODipine  (NORVASC ) 5 MG tablet Take 1 tablet (5 mg total) by mouth daily. Patient not taking: Reported on 07/23/2023 02/28/22   Palumbo, April, MD  amLODipine  (NORVASC ) 5 MG tablet amlodipine  5 mg tablet  TAKE 1 TABLET (5 MG TOTAL) BY MOUTH DAILY. Patient not taking: Reported on 07/23/2023    [provider]  Diclofenac  Sodium CR 100 MG 24 hr tablet Take 1 tablet (100 mg total) by mouth daily. Patient not taking: Reported on 07/23/2023 02/28/22   Palumbo, April, MD  gabapentin (NEURONTIN) 100 MG capsule gabapentin 100 mg capsule  TAKE 1 CAPSULE BY MOUTH THREE TIMES DAILY Patient not taking: Reported on 07/23/2023    [provider]  hydrochlorothiazide (HYDRODIURIL) 12.5 MG tablet Take 12.5 mg by mouth daily. Patient not taking: Reported on 07/23/2023 05/21/21   [provider]  HYDROcodone -acetaminophen  (NORCO) 7.5-325 MG tablet hydrocodone  7.5 mg-acetaminophen  325 mg tablet  TAKE 1 TABLET BY MOUTH  EVERY 6 HOURS AS NEEDED FOR MODERATE PAIN Patient not taking: Reported on 07/23/2023    [provider]  HYDROcodone -acetaminophen  (NORCO/VICODIN) 5-325 MG tablet Take 1 tablet by mouth every 4 (four) hours as needed. Patient not taking: Reported on 07/23/2023 03/18/23   Dean Clarity, MD  ibuprofen  (ADVIL ) 600 MG tablet Take 1 tablet (600 mg total) by mouth every 6 (six) hours as needed. Patient not taking: Reported on 07/27/2023 03/18/23   Dean Clarity, MD  lidocaine  (LIDODERM ) 5 % Place 1 patch onto the skin daily. Remove & Discard patch within 12 hours or as directed by  MD Patient not taking: Reported on 07/23/2023 02/28/22   Palumbo, April, MD  meloxicam (MOBIC) 15 MG tablet meloxicam 15 mg tablet  TAKE 1 TABLET BY MOUTH ONCE DAILY WITH MEALS Patient not taking: Reported on 07/23/2023    [provider]  meloxicam (MOBIC) 7.5 MG tablet meloxicam 7.5 mg tablet  TAKE 1 TABLET BY MOUTH TWICE DAILY WITH MEALS Patient not taking: Reported on 07/23/2023    [provider]  metaxalone  (SKELAXIN ) 800 MG tablet Take 1 tablet (800 mg total) by mouth 3 (three) times daily. Patient not taking: Reported on 07/23/2023 02/28/22   Palumbo, April, MD  ondansetron  (ZOFRAN -ODT) 4 MG disintegrating tablet Take 1 tablet (4 mg total) by mouth every 8 (eight) hours as needed. Patient not taking: Reported on 07/23/2023 09/24/21   Billy Asberry FALCON, PA-C  pravastatin (PRAVACHOL) 10 MG tablet Take 10 mg by mouth daily. Patient not taking: Reported on 07/23/2023 05/21/21   [provider]  propranolol (INDERAL) 40 MG tablet Take 40 mg by mouth 3 (three) times daily. 05/12/21   [provider]  propranolol (INDERAL) 40 MG tablet Take 1 tablet by mouth 2 (two) times daily.    [provider]    Family History Family History  Problem Relation Age of Onset   Diabetes Father    Hypertension Other    Diabetes Other    Coronary artery disease Other     Social History Social History   Tobacco Use   Smoking status: Never   Smokeless tobacco: Never  Vaping Use   Vaping status: Never Used  Substance Use Topics   Alcohol use: No   Drug use: No     Allergies   Patient has no known allergies.   Review of Systems Review of Systems  Constitutional:  Negative for chills and fever.  HENT:  Positive for congestion. Negative for ear pain and sore throat.   Eyes:  Negative for pain and visual disturbance.  Respiratory:  Positive for cough, shortness of breath and wheezing.   Cardiovascular:  Negative for chest pain and palpitations.   Gastrointestinal:  Negative for abdominal pain and vomiting.  Genitourinary:  Negative for dysuria and hematuria.  Musculoskeletal:  Negative for arthralgias and back pain.  Skin:  Negative for color change and rash.  Neurological:  Negative for seizures and syncope.  All other systems reviewed and are negative.    Physical Exam Triage Vital Signs ED Triage Vitals  Encounter Vitals Group     BP 07/27/23 0857 126/88     Systolic BP Percentile --      Diastolic BP Percentile --      Pulse Rate 07/27/23 0857 85     Resp 07/27/23 0857 16     Temp 07/27/23 0857 98.3 F (36.8 C)     Temp Source 07/27/23 0857 Oral     SpO2 07/27/23 0857  97 %     Weight --      Height --      Head Circumference --      Peak Flow --      Pain Score 07/27/23 0855 6     Pain Loc --      Pain Education --      Exclude from Growth Chart --    No data found.  Updated Vital Signs BP 126/88 (BP Location: Left Arm)   Pulse 85   Temp 98.3 F (36.8 C) (Oral)   Resp 16   SpO2 97%   Visual Acuity Right Eye Distance:   Left Eye Distance:   Bilateral Distance:    Right Eye Near:   Left Eye Near:    Bilateral Near:     Physical Exam Vitals and nursing note reviewed.  Constitutional:      General: He is not in acute distress.    Appearance: He is well-developed.  HENT:     Head: Normocephalic and atraumatic.  Eyes:     Conjunctiva/sclera: Conjunctivae normal.  Cardiovascular:     Rate and Rhythm: Normal rate and regular rhythm.     Heart sounds: No murmur heard. Pulmonary:     Effort: Pulmonary effort is normal. No respiratory distress.     Breath sounds: Normal breath sounds.  Abdominal:     Palpations: Abdomen is soft.     Tenderness: There is no abdominal tenderness.  Musculoskeletal:        General: No swelling.     Cervical back: Neck supple.  Skin:    General: Skin is warm and dry.     Capillary Refill: Capillary refill takes less than 2 seconds.  Neurological:     Mental  Status: He is alert.  Psychiatric:        Mood and Affect: Mood normal.      UC Treatments / Results  Labs (all labs ordered are listed, but only abnormal results are displayed) Labs Reviewed - No data to display  EKG   Radiology No results found.  Procedures Procedures (including critical care time)  Medications Ordered in UC Medications - No data to display  Initial Impression / Assessment and Plan / UC Course  I have reviewed the triage vital signs and the nursing notes.  Pertinent labs & imaging results that were available during my care of the patient were reviewed by me and considered in my medical decision making (see chart for details).     Will treat for acute sinusitis with cough.  Albuterol  inhaler prescribed to use as needed for wheezing.  Lungs clear to auscultation on exam.  Work note provided.  Return precautions given. Final Clinical Impressions(s) / UC Diagnoses   Final diagnoses:  Acute upper respiratory infection  Acute cough     Discharge Instructions      Recommend Mucinex and Flonase for congestion  Drink plenty of fluids, rest Take antibiotic as prescribed Can use inhaler as needed for wheezing  Return if no improvement or symptoms become worse.      ED Prescriptions     Medication Sig Dispense Auth. Provider   albuterol  (VENTOLIN  HFA) 108 (90 Base) MCG/ACT inhaler Inhale 1-2 puffs into the lungs every 6 (six) hours as needed for wheezing or shortness of breath. 1 each Ward, Harlene PEDLAR, PA-C   azithromycin  (ZITHROMAX  Z-PAK) 250 MG tablet Take two tablets by mouth on the first day and then 1 tablet by mouth for remaining day.  6 each Ward, Harlene PEDLAR, PA-C      PDMP not reviewed this encounter.   Ward, Harlene PEDLAR, PA-C 07/27/23 [phone]

## 2023-07-27 NOTE — ED Triage Notes (Signed)
 Pt seen here last week for same. States not feeling any better. Tried to go to work yesterday but didn't feel well. C/o fatigue, SOB, some cough, body aches

## 2023-08-19 ENCOUNTER — Encounter (HOSPITAL_COMMUNITY): Payer: Self-pay | Admitting: Emergency Medicine

## 2023-08-19 ENCOUNTER — Emergency Department (HOSPITAL_COMMUNITY): Payer: BC Managed Care – PPO

## 2023-08-19 ENCOUNTER — Emergency Department (HOSPITAL_COMMUNITY)
Admission: EM | Admit: 2023-08-19 | Discharge: 2023-08-19 | Disposition: A | Discharge status: Home / Self Care | Payer: BC Managed Care – PPO

## 2023-08-19 ENCOUNTER — Other Ambulatory Visit: Payer: Self-pay

## 2023-08-19 DIAGNOSIS — Z6833 Body mass index (BMI) 33.0-33.9, adult: Secondary | ICD-10-CM | POA: Insufficient documentation

## 2023-08-19 DIAGNOSIS — E669 Obesity, unspecified: Secondary | ICD-10-CM | POA: Insufficient documentation

## 2023-08-19 DIAGNOSIS — R791 Abnormal coagulation profile: Secondary | ICD-10-CM | POA: Insufficient documentation

## 2023-08-19 DIAGNOSIS — R079 Chest pain, unspecified: Secondary | ICD-10-CM | POA: Diagnosis present

## 2023-08-19 LAB — CBC WITH DIFFERENTIAL/PLATELET
Abs Immature Granulocytes: 0.01 10*3/uL (ref 0.00–0.07)
Basophils Absolute: 0.1 10*3/uL (ref 0.0–0.1)
Basophils Relative: 1 %
Eosinophils Absolute: 0.2 10*3/uL (ref 0.0–0.5)
Eosinophils Relative: 3 %
HCT: 51.5 % (ref 39.0–52.0)
Hemoglobin: 17.1 g/dL — ABNORMAL HIGH (ref 13.0–17.0)
Immature Granulocytes: 0 %
Lymphocytes Relative: 45 %
Lymphs Abs: 2.5 10*3/uL (ref 0.7–4.0)
MCH: 30.5 pg (ref 26.0–34.0)
MCHC: 33.2 g/dL (ref 30.0–36.0)
MCV: 92 fL (ref 80.0–100.0)
Monocytes Absolute: 0.5 10*3/uL (ref 0.1–1.0)
Monocytes Relative: 10 %
Neutro Abs: 2.3 10*3/uL (ref 1.7–7.7)
Neutrophils Relative %: 41 %
Platelets: 252 10*3/uL (ref 150–400)
RBC: 5.6 MIL/uL (ref 4.22–5.81)
RDW: 14.2 % (ref 11.5–15.5)
WBC: 5.5 10*3/uL (ref 4.0–10.5)
nRBC: 0 % (ref 0.0–0.2)

## 2023-08-19 LAB — COMPREHENSIVE METABOLIC PANEL
ALT: 23 U/L (ref 0–44)
AST: 26 U/L (ref 15–41)
Albumin: 4.2 g/dL (ref 3.5–5.0)
Alkaline Phosphatase: 64 U/L (ref 38–126)
Anion gap: 13 (ref 5–15)
BUN: 10 mg/dL (ref 6–20)
CO2: 29 mmol/L (ref 22–32)
Calcium: 9.9 mg/dL (ref 8.9–10.3)
Chloride: 97 mmol/L — ABNORMAL LOW (ref 98–111)
Creatinine, Ser: 1.18 mg/dL (ref 0.61–1.24)
GFR, Estimated: >60 mL/min (ref >60)
Glucose, Bld: 122 mg/dL — ABNORMAL HIGH (ref 70–99)
Potassium: 3.7 mmol/L (ref 3.5–5.1)
Sodium: 139 mmol/L (ref 135–145)
Total Bilirubin: 1.8 mg/dL — ABNORMAL HIGH (ref 0.0–1.2)
Total Protein: 8.3 g/dL — ABNORMAL HIGH (ref 6.5–8.1)

## 2023-08-19 LAB — TROPONIN I (HIGH SENSITIVITY)
Troponin I (High Sensitivity): 4 ng/L (ref <18)
Troponin I (High Sensitivity): 4 ng/L (ref <18)

## 2023-08-19 LAB — D-DIMER, QUANTITATIVE: D-Dimer, Quant: 0.83 ug{FEU}/mL — ABNORMAL HIGH (ref 0.00–0.50)

## 2023-08-19 MED ORDER — ACETAMINOPHEN 500 MG PO TABS
1000.0000 mg | ORAL_TABLET | Freq: Once | ORAL | Status: AC
  Administered 2023-08-19: 1000 mg via ORAL
  Filled 2023-08-19: qty 2

## 2023-08-19 MED ORDER — KETOROLAC TROMETHAMINE 30 MG/ML IJ SOLN
15.0000 mg | Freq: Once | INTRAMUSCULAR | Status: AC
  Administered 2023-08-19: 15 mg via INTRAVENOUS
  Filled 2023-08-19: qty 1

## 2023-08-19 MED ORDER — IBUPROFEN 600 MG PO TABS: 600.0000 mg | ORAL_TABLET | Freq: Four times a day (QID) | ORAL | 0 refills | Status: AC | PRN

## 2023-08-19 MED ORDER — IOHEXOL 350 MG/ML SOLN
75.0000 mL | Freq: Once | INTRAVENOUS | Status: AC | PRN
  Administered 2023-08-19: 75 mL via INTRAVENOUS

## 2023-08-19 NOTE — ED Triage Notes (Signed)
Presents from home via EMS for sharp CP starting suddenly this morning upon waking, radiates to R side and back.  Endorses SOB Denies fever, cough  Received 325ASA with EMS  EMS  170/90, 90HR, 100%. 123CBG  H/o HTN

## 2023-08-19 NOTE — ED Provider Notes (Signed)
  Physical Exam  BP 125/89 (BP Location: Right Arm)   Pulse 70   Temp 98.3 F (36.8 C)   Resp 18   Wt 117 kg   SpO2 100%   BMI 33.12 kg/m   Physical Exam Vitals and nursing note reviewed.  Constitutional:      General: He is not in acute distress.    Appearance: He is well-developed.  Cardiovascular:     Rate and Rhythm: Normal rate.  Pulmonary:     Effort: Pulmonary effort is normal.  Skin:    General: Skin is warm and dry.  Neurological:     Mental Status: He is alert.     Procedures  Procedures  ED Course / MDM   Clinical Course as of 08/19/23 1613  Tue Aug 19, 2023  1215 Temp: 98.6 F (37 C) [WF]    Clinical Course User Index [WF] Neldon Hamp RAMAN, [place]   Medical Decision Making Amount and/or Complexity of Data Reviewed Labs: ordered. Radiology: ordered.  Risk Prescription drug management.   d-dimer positive 1 day of pleuritic pain Work up negative Pending CTA r/o PE  CTA negative for PE or other pulmonary abnormality.   Patient reports he feels better although still symptomatic. Chart reviewed. Cardiac evaluation, labs negative.   He is stable for discharge home.        Odell Balls, PA-C 08/19/23 1920    Neysa Caron PARAS, DO 08/23/23 1104

## 2023-08-19 NOTE — ED Provider Notes (Signed)
 [place] EM[facility]ENCY DEPARTMENT AT [facility] Provider Note   CSN: [id number] Arrival date & time: 08/19/23  0801     History  Chief Complaint  Patient presents with   Chest Pain    Bobby Jones is a 43 y.o. male.   Chest Pain Patient is a 43 year old male with past medical history significant for  Patient emergency room today with complaints of nonexertional sharp pleuritic right-sided chest pain that also hurts when he leans backwards.  Endorses shortness of breath.  No nausea vomiting or abdominal pain.  Specifically no right upper quadrant abdominal pain.  No lightheadedness or dizziness.  No recent surgeries, hospitalization, long travel, hemoptysis, estrogen containing OCP, cancer history.  No unilateral leg swelling.  No history of PE or VTE.      Home Medications Prior to Admission medications   Medication Sig Start Date End Date Taking? Authorizing Provider  albuterol  (VENTOLIN  HFA) 108 (90 Base) MCG/ACT inhaler Inhale 1-2 puffs into the lungs every 6 (six) hours as needed for wheezing or shortness of breath. 07/27/23  Yes Ward, Harlene PEDLAR, PA-C  hydrochlorothiazide (HYDRODIURIL) 25 MG tablet Take 25 mg by mouth daily.   Yes [provider]  ibuprofen  (ADVIL ) 600 MG tablet Take 1 tablet (600 mg total) by mouth every 6 (six) hours as needed. 08/19/23  Yes Upstill, Margit, PA-C  pravastatin (PRAVACHOL) 10 MG tablet Take 1 tablet by mouth daily. 02/27/23  Yes [provider]  propranolol ER (INDERAL LA) 60 MG 24 hr capsule Take 60 mg by mouth daily.   Yes [provider]  azithromycin  (ZITHROMAX  Z-PAK) 250 MG tablet Take two tablets by mouth on the first day and then 1 tablet by mouth for remaining day. Patient not taking: Reported on 08/19/2023 07/27/23   Ward, Jessica Z, PA-C      Allergies    Patient has no known allergies.    Review of Systems   Review of Systems  Cardiovascular:  Positive for chest pain.    Physical Exam Updated  Vital Signs BP 129/84 (BP Location: Right Arm)   Pulse 67   Temp 98 F (36.7 C) (Oral)   Resp 17   Wt 117 kg   SpO2 100%   BMI 33.12 kg/m  Physical Exam Vitals and nursing note reviewed.  Constitutional:      General: He is not in acute distress.    Appearance: He is obese.  HENT:     Head: Normocephalic and atraumatic.     Nose: Nose normal.  Eyes:     General: No scleral icterus. Cardiovascular:     Rate and Rhythm: Normal rate and regular rhythm.     Pulses: Normal pulses.     Heart sounds: Normal heart sounds.  Pulmonary:     Effort: Pulmonary effort is normal. No respiratory distress.     Breath sounds: No wheezing.     Comments: Chest without any tenderness.  There are no abrasions or lacerations or rashes to the chest or abdomen. Chest:     Chest wall: No tenderness.  Abdominal:     Palpations: Abdomen is soft.     Tenderness: There is no abdominal tenderness.  Musculoskeletal:     Cervical back: Normal range of motion.     Right lower leg: No edema.     Left lower leg: No edema.  Skin:    General: Skin is warm and dry.     Capillary Refill: Capillary refill takes less than 2  seconds.  Neurological:     Mental Status: He is alert. Mental status is at baseline.  Psychiatric:        Mood and Affect: Mood normal.        Behavior: Behavior normal.     ED Results / Procedures / Treatments   Labs (all labs ordered are listed, but only abnormal results are displayed) Labs Reviewed  COMPREHENSIVE METABOLIC PANEL - Abnormal; Notable for the following components:      Result Value   Chloride 97 (*)    Glucose, Bld 122 (*)    Total Protein 8.3 (*)    Total Bilirubin 1.8 (*)    All other components within normal limits  CBC WITH DIFFERENTIAL/PLATELET - Abnormal; Notable for the following components:   Hemoglobin 17.1 (*)    All other components within normal limits  D-DIMER, QUANTITATIVE - Abnormal; Notable for the following components:   D-Dimer, Quant 0.83  (*)    All other components within normal limits  TROPONIN I (HIGH SENSITIVITY)  TROPONIN I (HIGH SENSITIVITY)    EKG EKG Interpretation Date/Time:  Tuesday August 19 2023 08:06:14 EST Ventricular Rate:  83 PR Interval:  136 QRS Duration:  96 QT Interval:  392 QTC Calculation: 460 R Axis:   44  Text Interpretation: Normal sinus rhythm Confirmed by Nettie, April ([id number]) on 08/20/2023 8:25:33 AM  Radiology CT Angio Chest PE W/Cm &/Or Wo Cm Result Date: 08/19/2023 CLINICAL DATA:  Pulmonary embolism (PE) suspected, low to intermediate prob, positive D-dimer Chest pain on awakening today with shortness of breath. EXAM: CT ANGIOGRAPHY CHEST WITH CONTRAST TECHNIQUE: Multidetector CT imaging of the chest was performed using the standard protocol during bolus administration of intravenous contrast. Multiplanar CT image reconstructions and MIPs were obtained to evaluate the vascular anatomy. RADIATION DOSE REDUCTION: This exam was performed according to the departmental dose-optimization program which includes automated exposure control, adjustment of the mA and/or kV according to patient size and/or use of iterative reconstruction technique. CONTRAST:  75mL OMNIPAQUE  IOHEXOL  350 MG/ML SOLN COMPARISON:  Radiographs 08/19/2023 and 05/29/2021. Chest CTA 02/28/2022. FINDINGS: Cardiovascular: The pulmonary arteries are well opacified with contrast to the level of the segmental branches. There is no evidence of acute pulmonary embolism. No systemic arterial abnormalities are identified. The heart size is normal. There is no pericardial effusion. Mediastinum/Nodes: There are no enlarged mediastinal, hilar or axillary lymph nodes. The thyroid gland, trachea and esophagus demonstrate no significant findings. Lungs/Pleura: No pleural effusion or pneumothorax. The lungs are clear. Upper abdomen: Moderate generalized hepatic steatosis, similar to previous CT. No acute findings are seen in the visualized upper  abdomen. Musculoskeletal/Chest wall: There is no chest wall mass or suspicious osseous finding. Moderate bilateral gynecomastia. Stable spondylosis with superior endplate Schmorl's node formation at T9. Review of the MIP images confirms the above findings. IMPRESSION: 1. No evidence of acute pulmonary embolism or other acute chest findings. 2. Stable hepatic steatosis. 3. Moderate bilateral gynecomastia. Electronically Signed   By: Elsie Perone M.D.   On: 08/19/2023 18:54   DG Chest 2 View Result Date: 08/19/2023 CLINICAL DATA:  Chest pain EXAM: CHEST - 2 VIEW COMPARISON:  None Available. FINDINGS: Normal mediastinum and cardiac silhouette. Normal pulmonary vasculature. No evidence of effusion, infiltrate, or pneumothorax. No acute bony abnormality. IMPRESSION: No acute cardiopulmonary process. Electronically Signed   By: Jackquline Boxer M.D.   On: 08/19/2023 09:05    Procedures Procedures    Medications Ordered in ED Medications  acetaminophen  (TYLENOL ) tablet 1,000  mg (1,000 mg Oral Given 08/19/23 1207)  ketorolac  (TORADOL ) 30 MG/ML injection 15 mg (15 mg Intravenous Given 08/19/23 1400)  iohexol  (OMNIPAQUE ) 350 MG/ML injection 75 mL (75 mLs Intravenous Contrast Given 08/19/23 1833)    ED Course/ Medical Decision Making/ A&P Clinical Course as of 08/20/23 0931  Tue Aug 19, 2023  1215 Temp: 98.6 F (37 C) [WF]    Clinical Course User Index [WF] Neldon Hamp RAMAN, [place]                                 Medical Decision Making Amount and/or Complexity of Data Reviewed Labs: ordered. Radiology: ordered.  Risk Prescription drug management.   This patient presents to the ED for concern of chest pain, shortness of breath, this involves a number of treatment options, and is a complaint that carries with it a moderate to high risk of complications and morbidity. A differential diagnosis was considered for the patient's symptoms which is discussed below:   The emergent causes of chest pain  include: Acute coronary syndrome, tamponade, pericarditis/myocarditis, aortic dissection, pulmonary embolism, tension pneumothorax, pneumonia, and esophageal rupture.     Co morbidities: Discussed in HPI   Brief History:  Patient emergency room today with complaints of nonexertional sharp pleuritic right-sided chest pain that also hurts when he leans backwards.  Endorses shortness of breath.  No nausea vomiting or abdominal pain.  Specifically no right upper quadrant abdominal pain.  No lightheadedness or dizziness.  No recent surgeries, hospitalization, long travel, hemoptysis, estrogen containing OCP, cancer history.  No unilateral leg swelling.  No history of PE or VTE.      EMR reviewed including pt PMHx, past surgical history and past visits to ER.   See HPI for more details   Lab Tests:   CBC and CMP unremarkable patient with very mildly elevated bilirubin at 1.8 no abdominal tenderness at all.  Given return precautions for this.  D-dimer elevated at 0.83 will obtain CT PE study.  Troponin normal   Imaging Studies:  PET scan negative, chest x-ray normal    Cardiac Monitoring:  NA EKG non-ischemic   Medicines ordered:  I ordered medication including Toradol  and Tylenol  for pain Reevaluation of the patient after these medicines showed that the patient improved I have reviewed the patients home medicines and have made adjustments as needed   Critical Interventions:     Consults/Attending Physician      Reevaluation:  After the interventions noted above I re-evaluated patient and found that they have :improved   Social Determinants of Health:      Problem List / ED Course:  Reassuring workup. Negative for PE. Suspect pericarditis or more likely costochondritis. I do not believe the patient has an emergent cause of chest pain, other urgent/non-acute considerations include, but are not limited to: chronic angina, aortic stenosis, cardiomyopathy,  mitral valve prolapse, pulmonary hypertension, aortic insufficiency, right ventricular hypertrophy, pleuritis, bronchitis, pneumothorax, tumor, gastroesophageal reflux disease (GERD), esophageal spasm, Mallory-Weiss syndrome, peptic ulcer disease, pancreatitis, functional gastrointestinal pain, cervical or thoracic disk disease or arthritis, shoulder arthritis, costochondritis, subacromial bursitis, anxiety or panic attack, herpes zoster, breast disorders, chest wall tumors, thoracic outlet syndrome, mediastinitis. Patient feels improved after Tylenol  and Toradol  in time here in the emergency department.   Dispostion:  After consideration of the diagnostic results and the patients response to treatment, I feel that the patent would benefit from outpatient follow-up.  Tylenol  ibuprofen   rest and return to the emergency room for any new or concerning symptoms.      Final Clinical Impression(s) / ED Diagnoses Final diagnoses:  Nonspecific chest pain    Rx / DC Orders ED Discharge Orders          Ordered    ibuprofen  (ADVIL ) 600 MG tablet  Every 6 hours PRN        08/19/23 1920              Neldon Hamp RAMAN, [place] 08/20/23 0931    Neysa Caron PARAS, DO 08/23/23 1106

## 2023-08-19 NOTE — Discharge Instructions (Addendum)
 Costochondritis and pericarditis are conditions that can cause some of these symptoms.  Your workup today has been reassuring for any serious or life threatening conditions. The ibuprofen  sent to your pharmacy is recommended. You can also use warm compresses and Tylenol .  Follow up with your doctor for recheck this week. Return to the ED with any new or concerning symptoms at any time.

## 2023-08-19 NOTE — ED Provider Triage Note (Signed)
 Emergency Medicine Provider Triage Evaluation Note  Bobby Jones , a 43 y.o. male  was evaluated in triage.  Pt complains of chest pain starting this morning.  Pain is right-sided, worse with movement but not exertional.  Dull, occasionally sharp.  No nausea or vomiting.  No abdominal pain.  Review of Systems  Positive: Chest pain, shortness of breath Negative: Nausea, vomiting, abdominal pain  Physical Exam  BP (!) 136/101 (BP Location: Right Arm)   Pulse 80   Temp 98.6 F (37 C) (Oral)   Resp 16   Wt 117 kg   SpO2 98%   BMI 33.12 kg/m  Gen:   Awake, no distress   Resp:  Normal effort  MSK:   Moves extremities without difficulty, pain is reproducible with palpation of the right side of the chest and right paraspinal muscles. Other:  Distal pulses intact  Medical Decision Making  Medically screening exam initiated at 8:17 AM.  Appropriate orders placed.  [name] was informed that the remainder of the evaluation will be completed by another provider, this initial triage assessment does not replace that evaluation, and the importance of remaining in the ED until their evaluation is complete.  Patient here for chest pain.  EKG without signs of acute ischemia.  Workup ordered.   [name], MD 08/19/23 [phone]

## 2024-01-26 ENCOUNTER — Encounter: Payer: Self-pay | Admitting: Physician Assistant

## 2024-03-19 ENCOUNTER — Ambulatory Visit: Admitting: Physician Assistant

## 2024-05-07 ENCOUNTER — Ambulatory Visit: Admitting: Physician Assistant

## 2024-05-21 NOTE — Progress Notes (Deleted)
 Chief Complaint: Frequent bowel movements and anal discharge  HPI:    Bobby Jones is a 43 year old male with a past medical history as listed below, who was referred to me by Valma Lannie LABOR, PA-C for a complaint of frequent bowel movements and anal discharge.      12/16/2022 patient saw digestive health for a change in bowel habits.  Apparently had a colonoscopy which I cannot see.    08/19/23 CBC with a hemoglobin of 17.1 and otherwise normal, CMP with elevated glucose of 122 and a total protein of 8.3 and otherwise normal.    04/26/2024 patient saw PCP and at that time was being seen for frequent bowel movements.  He had previously been given Dicyclomine.  It was somewhat helpful but did not resolve his symptoms so he discontinued.  Still with loose stools but stable.  Also noticed some lesions around his anus that he wanted evaluated.  Did have a history of anal penetrative intercourse years ago.  On their exam noted to have numerous verruca like lesions noted externally.  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  Past Medical History:  Diagnosis Date   Hyperlipidemia    Hypertension     Past Surgical History:  Procedure Laterality Date   CLOSED REDUCTION PATELLAR Left 07/05/2020   Procedure: ATTEMPTED CLOSED REDUCTION PATELLA;  Surgeon: Sharl Selinda Dover, MD;  Location: [facility] OR;  Service: Orthopedics;  Laterality: Left;   ORIF PATELLA Left 07/05/2020   Procedure: OPEN REDUCTION OF LATERAL PATELLA DISLOCATION;  Surgeon: Sharl Selinda Dover, MD;  Location: [facility];  Service: Orthopedics;  Laterality: Left;    Current Outpatient Medications  Medication Sig Dispense Refill   albuterol  (VENTOLIN  HFA) 108 (90 Base) MCG/ACT inhaler Inhale 1-2 puffs into the lungs every 6 (six) hours as needed for wheezing or shortness of breath. 1 each 0   azithromycin  (ZITHROMAX  Z-PAK) 250 MG tablet Take two tablets by mouth on the first day and then 1 tablet by mouth for remaining day.  (Patient not taking: Reported on 08/19/2023) 6 each 0   hydrochlorothiazide (HYDRODIURIL) 25 MG tablet Take 25 mg by mouth daily.     ibuprofen  (ADVIL ) 600 MG tablet Take 1 tablet (600 mg total) by mouth every 6 (six) hours as needed. 30 tablet 0   pravastatin (PRAVACHOL) 10 MG tablet Take 1 tablet by mouth daily.     propranolol ER (INDERAL LA) 60 MG 24 hr capsule Take 60 mg by mouth daily.     No current facility-administered medications for this visit.    Allergies as of 05/24/2024   (No Known Allergies)    Family History  Problem Relation Age of Onset   Diabetes Father    Hypertension Other    Diabetes Other    Coronary artery disease Other     Social History   Socioeconomic History   Marital status: Single    Spouse name: Not on file   Number of children: Not on file   Years of education: Not on file   Highest education level: Not on file  Occupational History   Not on file  Tobacco Use   Smoking status: Never   Smokeless tobacco: Never  Vaping Use   Vaping status: Never Used  Substance and Sexual Activity   Alcohol use: No   Drug use: No   Sexual activity: Not on file  Other Topics Concern   Not on file  Social History Narrative   ** Merged History  Encounter **       Social Drivers of Health   Financial Resource Strain: Low Risk  (12/22/2023)   Received from [facility]   Overall Financial Resource Strain (CARDIA)    Difficulty of Paying Living Expenses: Not very hard  Food Insecurity: No Food Insecurity (12/22/2023)   Received from [facility]   Hunger Vital Sign    Within the past 12 months, you worried that your food would run out before you got the money to buy more.: Never true    Within the past 12 months, the food you bought just didn't last and you didn't have money to get more.: Never true  Transportation Needs: No Transportation Needs (12/22/2023)   Received from [facility]   PRAPARE - Transportation    Lack of Transportation (Medical): No     Lack of Transportation (Non-Medical): No  Physical Activity: Unknown (12/22/2023)   Received from [facility]   Exercise Vital Sign    On average, how many days per week do you engage in moderate to strenuous exercise (like a brisk walk)?: 0 days    Minutes of Exercise per Session: Not on file  Stress: Stress Concern Present (12/22/2023)   Received from [facility] - Occupational Stress Questionnaire    Feeling of Stress : To some extent  Social Connections: Socially Integrated (10/07/2022)   Received from [facility]   Social Network    How would you rate your social network (family, work, friends)?: Good participation with social networks  Intimate Partner Violence: Not At Risk (12/22/2023)   Received from [facility]   HITS    Over the last 12 months how often did your partner physically hurt you?: Never    Over the last 12 months how often did your partner insult you or talk down to you?: Never    Over the last 12 months how often did your partner threaten you with physical harm?: Never    Over the last 12 months how often did your partner scream or curse at you?: Never    Review of Systems:    Constitutional: No weight loss, fever, chills, weakness or fatigue HEENT: Eyes: No change in vision               Ears, Nose, Throat:  No change in hearing or congestion Skin: No rash or itching Cardiovascular: No chest pain, chest pressure or palpitations   Respiratory: No SOB or cough Gastrointestinal: See HPI and otherwise negative Genitourinary: No dysuria or change in urinary frequency Neurological: No headache, dizziness or syncope Musculoskeletal: No new muscle or joint pain Hematologic: No bleeding or bruising Psychiatric: No history of depression or anxiety    Physical Exam:  Vital signs: There were no vitals taken for this visit.  Constitutional:   Pleasant Caucasian male appears to be in NAD, Well developed, Well nourished,  alert and cooperative Head:  Normocephalic and atraumatic. Eyes:   PEERL, EOMI. No icterus. Conjunctiva pink. Ears:  Normal auditory acuity. Neck:  Supple Throat: Oral cavity and pharynx without inflammation, swelling or lesion.  Respiratory: Respirations even and unlabored. Lungs clear to auscultation bilaterally.   No wheezes, crackles, or rhonchi.  Cardiovascular: Normal S1, S2. No MRG. Regular rate and rhythm. No peripheral edema, cyanosis or pallor.  Gastrointestinal:  Soft, nondistended, nontender. No rebound or guarding. Normal bowel sounds. No appreciable masses or hepatomegaly. Rectal:  Not performed.  Msk:  Symmetrical without gross deformities. Without  edema, no deformity or joint abnormality.  Neurologic:  Alert and  oriented x4;  grossly normal neurologically.  Skin:   Dry and intact without significant lesions or rashes. Psychiatric: Oriented to person, place and time. Demonstrates good judgement and reason without abnormal affect or behaviors.  RELEVANT LABS AND IMAGING: CBC    Component Value Date/Time   WBC 5.5 08/19/2023 0900   RBC 5.60 08/19/2023 0900   HGB 17.1 (H) 08/19/2023 0900   HCT 51.5 08/19/2023 0900   PLT 252 08/19/2023 0900   MCV 92.0 08/19/2023 0900   MCH 30.5 08/19/2023 0900   MCHC 33.2 08/19/2023 0900   RDW 14.2 08/19/2023 0900   LYMPHSABS 2.5 08/19/2023 0900   MONOABS 0.5 08/19/2023 0900   EOSABS 0.2 08/19/2023 0900   BASOSABS 0.1 08/19/2023 0900    CMP     Component Value Date/Time   NA 139 08/19/2023 0900   K 3.7 08/19/2023 0900   CL 97 (L) 08/19/2023 0900   CO2 29 08/19/2023 0900   GLUCOSE 122 (H) 08/19/2023 0900   BUN 10 08/19/2023 0900   CREATININE 1.18 08/19/2023 0900   CALCIUM  9.9 08/19/2023 0900   PROT 8.3 (H) 08/19/2023 0900   ALBUMIN 4.2 08/19/2023 0900   AST 26 08/19/2023 0900   ALT 23 08/19/2023 0900   ALKPHOS 64 08/19/2023 0900   BILITOT 1.8 (H) 08/19/2023 0900   GFRNONAA >60 08/19/2023 0900   GFRAA  10/14/2008 0432     >60        The eGFR has been calculated using the MDRD equation. This calculation has not been validated in all clinical situations. eGFR's persistently <60 mL/min signify possible Chronic Kidney Disease.    Assessment: 1.  Frequent bowel movements: 2.  Anal warts:  Plan: 1. ***     Delon Failing, PA-C  Gastroenterology 05/21/2024, 11:13 AM  Cc: Moreira, Niall A, PA-C

## 2024-05-24 ENCOUNTER — Ambulatory Visit: Admitting: Physician Assistant
